# Patient Record
Sex: Female | Born: 1990 | Race: Black or African American | Hispanic: No | Marital: Married | Smoking: Never smoker
Health system: Southern US, Community
[De-identification: ages and names within clinical notes are randomized; demographics above are authoritative.]

## PROBLEM LIST (undated history)

## (undated) ENCOUNTER — Ambulatory Visit: Discharge status: Absent without leave | Payer: No Typology Code available for payment source

## (undated) ENCOUNTER — Inpatient Hospital Stay (HOSPITAL_COMMUNITY): Payer: Self-pay

## (undated) DIAGNOSIS — O0930 Supervision of pregnancy with insufficient antenatal care, unspecified trimester: Secondary | ICD-10-CM

## (undated) DIAGNOSIS — Z8249 Family history of ischemic heart disease and other diseases of the circulatory system: Secondary | ICD-10-CM

## (undated) DIAGNOSIS — Z6791 Unspecified blood type, Rh negative: Secondary | ICD-10-CM

## (undated) DIAGNOSIS — O26899 Other specified pregnancy related conditions, unspecified trimester: Secondary | ICD-10-CM

## (undated) DIAGNOSIS — R7611 Nonspecific reaction to tuberculin skin test without active tuberculosis: Secondary | ICD-10-CM

## (undated) DIAGNOSIS — N390 Urinary tract infection, site not specified: Secondary | ICD-10-CM

## (undated) DIAGNOSIS — D649 Anemia, unspecified: Secondary | ICD-10-CM

## (undated) HISTORY — PX: WISDOM TOOTH EXTRACTION: SHX21

## (undated) HISTORY — DX: Unspecified blood type, rh negative: Z67.91

## (undated) HISTORY — DX: Supervision of pregnancy with insufficient antenatal care, unspecified trimester: O09.30

## (undated) HISTORY — DX: Other specified pregnancy related conditions, unspecified trimester: O26.899

## (undated) HISTORY — DX: Nonspecific reaction to tuberculin skin test without active tuberculosis: R76.11

## (undated) HISTORY — DX: Family history of ischemic heart disease and other diseases of the circulatory system: Z82.49

---
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
## 2009-09-14 ENCOUNTER — Encounter: Admission: RE | Admit: 2009-09-14 | Discharge: 2009-09-14 | Payer: Self-pay | Admitting: Infectious Diseases

## 2010-06-03 ENCOUNTER — Emergency Department (HOSPITAL_COMMUNITY): Admission: EM | Admit: 2010-06-03 | Discharge: 2010-06-03 | Payer: Self-pay | Admitting: Emergency Medicine

## 2010-06-13 ENCOUNTER — Emergency Department (HOSPITAL_COMMUNITY): Admission: EM | Admit: 2010-06-13 | Discharge: 2010-06-13 | Payer: Self-pay | Admitting: Emergency Medicine

## 2010-10-08 LAB — RPR: RPR Ser Ql: NONREACTIVE

## 2010-10-08 LAB — DIFFERENTIAL
Basophils Absolute: 0.1 10*3/uL (ref 0.0–0.1)
Lymphocytes Relative: 30 % (ref 12–46)
Neutro Abs: 4.3 10*3/uL (ref 1.7–7.7)

## 2010-10-08 LAB — CBC
HCT: 35.4 % — ABNORMAL LOW (ref 36.0–46.0)
Platelets: 242 10*3/uL (ref 150–400)
RDW: 15.4 % (ref 11.5–15.5)
WBC: 8 10*3/uL (ref 4.0–10.5)

## 2011-04-17 ENCOUNTER — Encounter (HOSPITAL_COMMUNITY): Payer: Self-pay | Admitting: *Deleted

## 2011-04-17 ENCOUNTER — Inpatient Hospital Stay (HOSPITAL_COMMUNITY)
Admission: AD | Admit: 2011-04-17 | Discharge: 2011-04-17 | Disposition: A | Discharge status: Home / Self Care | Payer: Self-pay | Source: Ambulatory Visit | Attending: Obstetrics & Gynecology | Admitting: Obstetrics & Gynecology

## 2011-04-17 DIAGNOSIS — B3731 Acute candidiasis of vulva and vagina: Secondary | ICD-10-CM | POA: Insufficient documentation

## 2011-04-17 DIAGNOSIS — O2390 Unspecified genitourinary tract infection in pregnancy, unspecified trimester: Secondary | ICD-10-CM | POA: Insufficient documentation

## 2011-04-17 DIAGNOSIS — N390 Urinary tract infection, site not specified: Secondary | ICD-10-CM

## 2011-04-17 DIAGNOSIS — L293 Anogenital pruritus, unspecified: Secondary | ICD-10-CM | POA: Insufficient documentation

## 2011-04-17 DIAGNOSIS — B373 Candidiasis of vulva and vagina: Secondary | ICD-10-CM

## 2011-04-17 LAB — URINE MICROSCOPIC-ADD ON

## 2011-04-17 LAB — URINALYSIS, ROUTINE W REFLEX MICROSCOPIC
Nitrite: NEGATIVE
Specific Gravity, Urine: 1.025 (ref 1.005–1.030)
Urobilinogen, UA: 2 mg/dL — ABNORMAL HIGH (ref 0.0–1.0)

## 2011-04-17 LAB — WET PREP, GENITAL

## 2011-04-17 MED ORDER — FLUCONAZOLE 150 MG PO TABS
150.0000 mg | ORAL_TABLET | Freq: Once | ORAL | Status: AC
  Administered 2011-04-17: 150 mg via ORAL
  Filled 2011-04-17: qty 1

## 2011-04-17 MED ORDER — FLUCONAZOLE 150 MG PO TABS: 150.0000 mg | ORAL_TABLET | Freq: Once | ORAL | Status: AC

## 2011-04-17 MED ORDER — CEPHALEXIN 500 MG PO CAPS: 500.0000 mg | ORAL_CAPSULE | Freq: Three times a day (TID) | ORAL | Status: AC

## 2011-04-17 NOTE — Progress Notes (Signed)
Should be 13wk, doesn't feel preg anymore

## 2011-04-17 NOTE — ED Provider Notes (Signed)
History     Chief Complaint  Patient presents with  . Vaginal Discharge   The history is provided by the patient.   Pt is 14weeks 5 days pregnant and complains of vaginal itching and burning.  She denies abdominal cramping or bleeding.  She is waiting for Medicaid  Past Medical History  Diagnosis Date  . No pertinent past medical history     Past Surgical History  Procedure Date  . Wisdom tooth extraction     No family history on file.  History  Substance Use Topics  . Smoking status: Never Smoker   . Smokeless tobacco: Not on file  . Alcohol Use: No    Allergies: No Known Allergies  No prescriptions prior to admission    Review of Systems  Constitutional: Negative for fever.  Gastrointestinal: Negative for nausea, abdominal pain, diarrhea and constipation.  Neurological: Negative for headaches.   Physical Exam   Blood pressure 137/87, pulse 99, temperature 98.2 F (36.8 C), temperature source Oral, resp. rate 20, height 5\' 3"  (1.6 m), weight 155 lb 6.4 oz (70.489 kg), last menstrual period 01/04/2011.  Physical Exam  Vitals reviewed. Constitutional: She is oriented to person, place, and time. She appears well-developed.  HENT:  Head: Normocephalic.  Eyes: Pupils are equal, round, and reactive to light.  Neck: Normal range of motion.  Cardiovascular: Normal rate.   GI: Soft. There is no tenderness. There is no guarding.       FHR audible with doppler  Genitourinary:       Vaginal mucosa with thickened white discharge in vault; cervix clean NT; uterus 14-16 weeks size NT; adnexal without palpable enlargement or tenderness  Neurological: She is alert and oriented to person, place, and time.  Skin: Skin is warm and dry.    MAU Course  Procedures Pelvic exam Wet prep and GC/ Chlamydia FHT audible with doppler u/a MDM   Assessment and Plan  IUP with audible heart tones Yeast vaginitis clinically- one dose of Diflucan given in MAU Care transferred  over to [facility], NP [facility] 04/17/2011, 4:34 PM   Results for orders placed during the hospital encounter of 04/17/11 (from the past 24 hour(s))  URINALYSIS, ROUTINE W REFLEX MICROSCOPIC     Status: Abnormal   Collection Time   04/17/11  2:56 PM      Component Value Range   Color, Urine YELLOW  YELLOW    Appearance HAZY (*) CLEAR    Specific Gravity, Urine 1.025  1.005 - 1.030    pH 7.0  5.0 - 8.0    Glucose, UA NEGATIVE  NEGATIVE (mg/dL)   Hgb urine dipstick NEGATIVE  NEGATIVE    Bilirubin Urine NEGATIVE  NEGATIVE    Ketones, ur 15 (*) NEGATIVE (mg/dL)   Protein, ur NEGATIVE  NEGATIVE (mg/dL)   Urobilinogen, UA 2.0 (*) 0.0 - 1.0 (mg/dL)   Nitrite NEGATIVE  NEGATIVE    Leukocytes, UA MODERATE (*) NEGATIVE   URINE MICROSCOPIC-ADD ON     Status: Abnormal   Collection Time   04/17/11  2:56 PM      Component Value Range   Squamous Epithelial / LPF MANY (*) RARE    WBC, UA 7-10  <3 (WBC/hpf)   RBC / HPF 7-10  <3 (RBC/hpf)   Bacteria, UA MANY (*) RARE    Urine-Other MUCOUS PRESENT    WET PREP, GENITAL     Status: Abnormal   Collection Time   04/17/11  4:45 PM  Component Value Range   Yeast, Wet Prep MODERATE (*) NONE SEEN    Trich, Wet Prep NONE SEEN  NONE SEEN    Clue Cells, Wet Prep NONE SEEN  NONE SEEN    WBC, Wet Prep HPF POC MANY (*) NONE SEEN    Assessment:  UTI   Monilia vaginitis  Plan:  Keflex 500 mg. Po tid x 7 days   Diflucan 150 mg. Now and repeat in 3 days   Start Prenatal care.  [place], [place] 04/17/11 1753

## 2011-04-17 NOTE — Progress Notes (Signed)
Pt in to find out if everything is okay with the pregnancy.  Was told by the health department that she was pregnant back in August.  LMP 01/04/2011.  Denies any pain, bleeding.  Reports white, odorous, thick discharge with itching.

## 2011-04-18 LAB — GC/CHLAMYDIA PROBE AMP, GENITAL: GC Probe Amp, Genital: NEGATIVE

## 2011-06-18 ENCOUNTER — Encounter (HOSPITAL_COMMUNITY): Payer: Self-pay | Admitting: *Deleted

## 2011-06-18 ENCOUNTER — Inpatient Hospital Stay (HOSPITAL_COMMUNITY)
Admission: AD | Admit: 2011-06-18 | Discharge: 2011-06-18 | Disposition: A | Discharge status: Home / Self Care | Payer: Medicaid Other | Source: Ambulatory Visit | Attending: Obstetrics and Gynecology | Admitting: Obstetrics and Gynecology

## 2011-06-18 DIAGNOSIS — N390 Urinary tract infection, site not specified: Secondary | ICD-10-CM

## 2011-06-18 DIAGNOSIS — O99891 Other specified diseases and conditions complicating pregnancy: Secondary | ICD-10-CM | POA: Insufficient documentation

## 2011-06-18 DIAGNOSIS — N949 Unspecified condition associated with female genital organs and menstrual cycle: Secondary | ICD-10-CM | POA: Insufficient documentation

## 2011-06-18 HISTORY — DX: Urinary tract infection, site not specified: N39.0

## 2011-06-18 LAB — URINALYSIS, ROUTINE W REFLEX MICROSCOPIC
Hgb urine dipstick: NEGATIVE
Protein, ur: NEGATIVE mg/dL
Urobilinogen, UA: 0.2 mg/dL (ref 0.0–1.0)

## 2011-06-18 LAB — URINE MICROSCOPIC-ADD ON

## 2011-06-18 NOTE — Progress Notes (Signed)
Haven't been seen since last vist here.  Has first appt at [facility] 11/27 with the nurse. Feels like 'rumbling' in abd.

## 2011-06-18 NOTE — Progress Notes (Signed)
Patient states she is here to see if her UTI has resolved

## 2011-06-18 NOTE — ED Provider Notes (Signed)
Jamie Barker is a 20 y.o. female presenting at [redacted]w[redacted]d for follow-up on a UTI.  Patient was seen in the MAU previously with complaints on vaginal itching and burning and was placed on keflex 500mg  po BID x 7days.  She return today for test of cure.  Denies any vaginal leakage or bleeding.  Not feeling any contractions.  Endorses fetal movement. History OB History    Grav Para Term Preterm Abortions TAB SAB Ect Mult Living   1              Past Medical History  Diagnosis Date  . No pertinent past medical history   . UTI (urinary tract infection)    Past Surgical History  Procedure Date  . Wisdom tooth extraction    Family History: family history is not on file. Social History:  reports that she has never smoked. She does not have any smokeless tobacco history on file. She reports that she does not drink alcohol or use illicit drugs.  ROS As per HPI   Blood pressure 113/54, pulse 88, temperature 99.1 F (37.3 C), temperature source Oral, resp. rate 16, height 5\' 3"  (1.6 m), weight 73.029 kg (161 lb), last menstrual period 01/04/2011. Exam Physical Exam  Constitutional: She is oriented to person, place, and time. She appears well-developed and well-nourished.  HENT:  Head: Normocephalic.  Cardiovascular: Normal rate, regular rhythm and normal heart sounds.   Respiratory: Effort normal and breath sounds normal. She has no wheezes. She has no rales.  GI: Soft. There is no tenderness. There is no rebound and no guarding.  Neurological: She is alert and oriented to person, place, and time.  Skin: Skin is warm and dry.  Psychiatric: She has a normal mood and affect. Her behavior is normal. Judgment and thought content normal.    Prenatal labs: ABO, Rh:   Antibody:   Rubella:   RPR:    HBsAg:    HIV:    GBS:    Results for orders placed during the hospital encounter of 06/18/11 (from the past 24 hour(s))  URINALYSIS, ROUTINE W REFLEX MICROSCOPIC     Status: Abnormal   Collection Time   06/18/11  6:05 PM      Component Value Range   Color, Urine YELLOW  YELLOW    Appearance HAZY (*) CLEAR    Specific Gravity, Urine 1.020  1.005 - 1.030    pH 7.5  5.0 - 8.0    Glucose, UA 100 (*) NEGATIVE (mg/dL)   Hgb urine dipstick NEGATIVE  NEGATIVE    Bilirubin Urine NEGATIVE  NEGATIVE    Ketones, ur NEGATIVE  NEGATIVE (mg/dL)   Protein, ur NEGATIVE  NEGATIVE (mg/dL)   Urobilinogen, UA 0.2  0.0 - 1.0 (mg/dL)   Nitrite NEGATIVE  NEGATIVE    Leukocytes, UA SMALL (*) NEGATIVE   URINE MICROSCOPIC-ADD ON     Status: Abnormal   Collection Time   06/18/11  6:05 PM      Component Value Range   Squamous Epithelial / LPF MANY (*) RARE    WBC, UA 7-10  <3 (WBC/hpf)   Bacteria, UA FEW (*) RARE    Urine-Other MUCOUS PRESENT     Assessment/Plan: A: 20 y.o. presenting at [redacted]w[redacted]d for follow-up test of cure on UIT P: On urinalysis today patient's finding and not consistent with continue infection, in addition she is now asymptomatic.  Will send urine for culture and follow-up with patient on any abnormal growth.  Drucie Ip 06/18/2011,  10:07 PM

## 2011-06-18 NOTE — Progress Notes (Signed)
Denies n/v/d/c.  No bleeding, no pain today. Denies any urinary symptoms.   G1

## 2011-06-24 LAB — ABO/RH: RH Type: NEGATIVE

## 2011-06-24 LAB — SICKLE CELL SCREEN: Sickle Cell Screen: NEGATIVE

## 2011-07-23 ENCOUNTER — Inpatient Hospital Stay (HOSPITAL_COMMUNITY)
Admission: AD | Admit: 2011-07-23 | Discharge: 2011-07-24 | Disposition: A | Discharge status: Home / Self Care | Payer: Medicaid Other | Source: Ambulatory Visit | Attending: Obstetrics and Gynecology | Admitting: Obstetrics and Gynecology

## 2011-07-23 DIAGNOSIS — Z298 Encounter for other specified prophylactic measures: Secondary | ICD-10-CM | POA: Insufficient documentation

## 2011-07-23 DIAGNOSIS — Z3480 Encounter for supervision of other normal pregnancy, unspecified trimester: Secondary | ICD-10-CM | POA: Insufficient documentation

## 2011-07-23 DIAGNOSIS — Z2989 Encounter for other specified prophylactic measures: Secondary | ICD-10-CM | POA: Insufficient documentation

## 2011-07-23 MED ORDER — RHO D IMMUNE GLOBULIN 1500 UNIT/2ML IJ SOLN
300.0000 ug | Freq: Once | INTRAMUSCULAR | Status: AC
  Administered 2011-07-24: 300 ug via INTRAMUSCULAR

## 2011-07-23 NOTE — Progress Notes (Signed)
Pt presents for rhophylac injection

## 2011-07-24 LAB — RH IG WORKUP (INCLUDES ABO/RH)
Fetal Screen: NEGATIVE
Gestational Age(Wks): 28

## 2011-09-26 ENCOUNTER — Ambulatory Visit (INDEPENDENT_AMBULATORY_CARE_PROVIDER_SITE_OTHER): Payer: Medicaid Other | Admitting: Obstetrics and Gynecology

## 2011-09-26 DIAGNOSIS — B3731 Acute candidiasis of vulva and vagina: Secondary | ICD-10-CM

## 2011-09-26 DIAGNOSIS — Z3480 Encounter for supervision of other normal pregnancy, unspecified trimester: Secondary | ICD-10-CM

## 2011-09-26 DIAGNOSIS — B373 Candidiasis of vulva and vagina: Secondary | ICD-10-CM

## 2011-09-30 ENCOUNTER — Encounter: Payer: Medicaid Other | Admitting: Obstetrics and Gynecology

## 2011-10-03 ENCOUNTER — Encounter (INDEPENDENT_AMBULATORY_CARE_PROVIDER_SITE_OTHER): Payer: Medicaid Other

## 2011-10-03 ENCOUNTER — Encounter: Payer: Medicaid Other | Admitting: Obstetrics and Gynecology

## 2011-10-03 DIAGNOSIS — Z3400 Encounter for supervision of normal first pregnancy, unspecified trimester: Secondary | ICD-10-CM

## 2011-10-03 DIAGNOSIS — O26849 Uterine size-date discrepancy, unspecified trimester: Secondary | ICD-10-CM

## 2011-10-07 ENCOUNTER — Other Ambulatory Visit: Payer: Self-pay | Admitting: Obstetrics and Gynecology

## 2011-10-07 ENCOUNTER — Inpatient Hospital Stay (HOSPITAL_COMMUNITY): Payer: Medicaid Other

## 2011-10-07 ENCOUNTER — Encounter (HOSPITAL_COMMUNITY): Payer: Self-pay | Admitting: *Deleted

## 2011-10-07 ENCOUNTER — Inpatient Hospital Stay (HOSPITAL_COMMUNITY)
Admission: AD | Admit: 2011-10-07 | Discharge: 2011-10-07 | Disposition: A | Discharge status: Home / Self Care | Payer: Medicaid Other | Source: Ambulatory Visit | Attending: Obstetrics and Gynecology | Admitting: Obstetrics and Gynecology

## 2011-10-07 ENCOUNTER — Encounter (INDEPENDENT_AMBULATORY_CARE_PROVIDER_SITE_OTHER): Payer: Medicaid Other | Admitting: Registered Nurse

## 2011-10-07 DIAGNOSIS — O368190 Decreased fetal movements, unspecified trimester, not applicable or unspecified: Secondary | ICD-10-CM

## 2011-10-07 DIAGNOSIS — O368390 Maternal care for abnormalities of the fetal heart rate or rhythm, unspecified trimester, not applicable or unspecified: Secondary | ICD-10-CM | POA: Insufficient documentation

## 2011-10-07 DIAGNOSIS — Z331 Pregnant state, incidental: Secondary | ICD-10-CM

## 2011-10-07 DIAGNOSIS — Z3689 Encounter for other specified antenatal screening: Secondary | ICD-10-CM

## 2011-10-07 HISTORY — DX: Anemia, unspecified: D64.9

## 2011-10-07 NOTE — MAU Note (Signed)
History   21 yo G1P0 at 35 3/7 weeks presented from office for monitoring/BPP due to non-reactive NST in the office.  Called the office with report of decreased FM x 4 days--now reports feeling fetal movement of at least 10/day, but "just less than usual".  Denies leaking or bleeding, now noting +FM.  NST at office was non-reactive, with no availability of [place] for BPP.  Pregnancy remarkable for: Late to care at 25 weeks Rh negative Mild anemia UTI during pregnancy GBS negative    OB History    Grav Para Term Preterm Abortions TAB SAB Ect Mult Living   1 0 0 0 0 0 0 0 0 0       Past Medical History  Diagnosis Date  . UTI (urinary tract infection)   . Anemia     Past Surgical History  Procedure Date  . Wisdom tooth extraction     Family History  Problem Relation Age of Onset  . Anemia Mother     History  Substance Use Topics  . Smoking status: Never Smoker   . Smokeless tobacco: Not on file  . Alcohol Use: No    Allergies: No Known Allergies  Prescriptions prior to admission  Medication Sig Dispense Refill  . Prenatal Vit-Fe Fumarate-FA (PRENATAL MULTIVITAMIN) TABS Take 1 tablet by mouth daily.         Physical Exam   Blood pressure 126/75, pulse 91, temperature 98 F (36.7 C), temperature source Oral, resp. rate 18, last menstrual period 01/04/2011.  Chest clear Heart RRR without murmur Abd gravid, NT Pelvic--deferred Ext WNL  FHR reactive on initial tracing, no decels No UCs.  ED Course  IUP at 39 3/7 weeks Non-reactive NST at office  Consulted with Dr. Normand Sloop Will do BPP--if WNL will d/c home with [facility]. Has ROB appointment on Friday this week.  Nigel Bridgeman, CNM, [place] 09/09/11 5:35pm  Addendum: Returned from [place]:  BPP 8/8, normal fluid FHR remains reactive, occasional contractions  Reviewed FKCs, s/s labor, plan of care at term Keep scheduled apppointment at [facility] on Friday, or call prn.  Nigel Bridgeman, CNM, [place] 10/07/11 6:15pm

## 2011-10-07 NOTE — Discharge Instructions (Signed)

## 2011-10-10 ENCOUNTER — Encounter (INDEPENDENT_AMBULATORY_CARE_PROVIDER_SITE_OTHER): Payer: Medicaid Other | Admitting: Obstetrics and Gynecology

## 2011-10-10 DIAGNOSIS — Z331 Pregnant state, incidental: Secondary | ICD-10-CM

## 2011-10-15 ENCOUNTER — Other Ambulatory Visit (INDEPENDENT_AMBULATORY_CARE_PROVIDER_SITE_OTHER): Payer: Medicaid Other

## 2011-10-15 ENCOUNTER — Encounter: Payer: Medicaid Other | Admitting: Obstetrics and Gynecology

## 2011-10-15 DIAGNOSIS — O480 Post-term pregnancy: Secondary | ICD-10-CM

## 2011-10-16 ENCOUNTER — Encounter (HOSPITAL_COMMUNITY): Payer: Self-pay | Admitting: *Deleted

## 2011-10-16 ENCOUNTER — Telehealth (HOSPITAL_COMMUNITY): Payer: Self-pay | Admitting: *Deleted

## 2011-10-16 NOTE — Telephone Encounter (Signed)
Preadmission screen  

## 2011-10-18 ENCOUNTER — Encounter (HOSPITAL_COMMUNITY): Payer: Self-pay | Admitting: *Deleted

## 2011-10-18 ENCOUNTER — Encounter (HOSPITAL_COMMUNITY): Payer: Self-pay | Admitting: Anesthesiology

## 2011-10-18 ENCOUNTER — Inpatient Hospital Stay (HOSPITAL_COMMUNITY): Payer: Medicaid Other | Admitting: Anesthesiology

## 2011-10-18 ENCOUNTER — Inpatient Hospital Stay (HOSPITAL_COMMUNITY)
Admission: AD | Admit: 2011-10-18 | Discharge: 2011-10-21 | DRG: 775 | Disposition: A | Discharge status: Home / Self Care | Payer: Medicaid Other | Source: Ambulatory Visit | Attending: Obstetrics and Gynecology | Admitting: Obstetrics and Gynecology

## 2011-10-18 DIAGNOSIS — IMO0001 Reserved for inherently not codable concepts without codable children: Secondary | ICD-10-CM

## 2011-10-18 DIAGNOSIS — O480 Post-term pregnancy: Secondary | ICD-10-CM | POA: Diagnosis present

## 2011-10-18 DIAGNOSIS — D649 Anemia, unspecified: Secondary | ICD-10-CM | POA: Diagnosis not present

## 2011-10-18 DIAGNOSIS — Z331 Pregnant state, incidental: Secondary | ICD-10-CM

## 2011-10-18 DIAGNOSIS — O9903 Anemia complicating the puerperium: Secondary | ICD-10-CM | POA: Diagnosis not present

## 2011-10-18 LAB — CBC
MCH: 26.9 pg (ref 26.0–34.0)
MCHC: 31.9 g/dL (ref 30.0–36.0)
MCV: 84.2 fL (ref 78.0–100.0)
Platelets: 247 10*3/uL (ref 150–400)
RDW: 16.8 % — ABNORMAL HIGH (ref 11.5–15.5)

## 2011-10-18 LAB — RPR: RPR Ser Ql: NONREACTIVE

## 2011-10-18 MED ORDER — OXYTOCIN BOLUS FROM INFUSION
500.0000 mL | Freq: Once | INTRAVENOUS | Status: DC
  Filled 2011-10-18: qty 500

## 2011-10-18 MED ORDER — ONDANSETRON HCL 4 MG/2ML IJ SOLN: 4.0000 mg | Freq: Four times a day (QID) | INTRAMUSCULAR | Status: DC | PRN

## 2011-10-18 MED ORDER — NALBUPHINE SYRINGE 5 MG/0.5 ML: 5.0000 mg | INJECTION | INTRAMUSCULAR | Status: DC | PRN

## 2011-10-18 MED ORDER — IBUPROFEN 600 MG PO TABS
600.0000 mg | ORAL_TABLET | Freq: Four times a day (QID) | ORAL | Status: DC | PRN
  Administered 2011-10-19: 600 mg via ORAL
  Filled 2011-10-18: qty 1

## 2011-10-18 MED ORDER — LACTATED RINGERS IV SOLN
500.0000 mL | Freq: Once | INTRAVENOUS | Status: AC
  Administered 2011-10-18: 500 mL via INTRAVENOUS

## 2011-10-18 MED ORDER — PHENYLEPHRINE 40 MCG/ML (10ML) SYRINGE FOR IV PUSH (FOR BLOOD PRESSURE SUPPORT)
80.0000 ug | PREFILLED_SYRINGE | INTRAVENOUS | Status: DC | PRN
  Filled 2011-10-18: qty 5

## 2011-10-18 MED ORDER — DIPHENHYDRAMINE HCL 50 MG/ML IJ SOLN: 12.5000 mg | INTRAMUSCULAR | Status: DC | PRN

## 2011-10-18 MED ORDER — FENTANYL 2.5 MCG/ML BUPIVACAINE 1/10 % EPIDURAL INFUSION (WH - ANES)
14.0000 mL/h | INTRAMUSCULAR | Status: DC
  Administered 2011-10-18: 14 mL/h via EPIDURAL
  Administered 2011-10-18: 14.5 mL/h via EPIDURAL
  Administered 2011-10-18 – 2011-10-19 (×2): 14 mL/h via EPIDURAL
  Filled 2011-10-18 (×4): qty 60

## 2011-10-18 MED ORDER — LIDOCAINE HCL (PF) 1 % IJ SOLN
INTRAMUSCULAR | Status: DC | PRN
  Administered 2011-10-18 (×2): 5 mL

## 2011-10-18 MED ORDER — PHENYLEPHRINE 40 MCG/ML (10ML) SYRINGE FOR IV PUSH (FOR BLOOD PRESSURE SUPPORT): 80.0000 ug | PREFILLED_SYRINGE | INTRAVENOUS | Status: DC | PRN

## 2011-10-18 MED ORDER — OXYTOCIN 20 UNITS IN LACTATED RINGERS INFUSION - SIMPLE
125.0000 mL/h | Freq: Once | INTRAVENOUS | Status: AC
  Administered 2011-10-19: 125 mL/h via INTRAVENOUS

## 2011-10-18 MED ORDER — EPHEDRINE 5 MG/ML INJ
10.0000 mg | INTRAVENOUS | Status: DC | PRN
  Filled 2011-10-18: qty 4

## 2011-10-18 MED ORDER — OXYTOCIN 20 UNITS IN LACTATED RINGERS INFUSION - SIMPLE
1.0000 m[IU]/min | INTRAVENOUS | Status: DC
  Administered 2011-10-18: 1 m[IU]/min via INTRAVENOUS
  Filled 2011-10-18: qty 1000

## 2011-10-18 MED ORDER — ACETAMINOPHEN 325 MG PO TABS: 650.0000 mg | ORAL_TABLET | ORAL | Status: DC | PRN

## 2011-10-18 MED ORDER — EPHEDRINE 5 MG/ML INJ: 10.0000 mg | INTRAVENOUS | Status: DC | PRN

## 2011-10-18 MED ORDER — CITRIC ACID-SODIUM CITRATE 334-500 MG/5ML PO SOLN: 30.0000 mL | ORAL | Status: DC | PRN

## 2011-10-18 MED ORDER — TERBUTALINE SULFATE 1 MG/ML IJ SOLN: 0.2500 mg | Freq: Once | INTRAMUSCULAR | Status: AC | PRN

## 2011-10-18 MED ORDER — FLEET ENEMA 7-19 GM/118ML RE ENEM: 1.0000 | ENEMA | RECTAL | Status: DC | PRN

## 2011-10-18 MED ORDER — LACTATED RINGERS IV SOLN: 500.0000 mL | INTRAVENOUS | Status: DC | PRN

## 2011-10-18 MED ORDER — LIDOCAINE HCL (PF) 1 % IJ SOLN: 30.0000 mL | INTRAMUSCULAR | Status: DC | PRN

## 2011-10-18 MED ORDER — OXYCODONE-ACETAMINOPHEN 5-325 MG PO TABS: 1.0000 | ORAL_TABLET | ORAL | Status: DC | PRN

## 2011-10-18 MED ORDER — LACTATED RINGERS IV SOLN
INTRAVENOUS | Status: DC
  Administered 2011-10-18 – 2011-10-19 (×3): via INTRAVENOUS

## 2011-10-18 NOTE — Anesthesia Preprocedure Evaluation (Signed)

## 2011-10-18 NOTE — MAU Provider Note (Signed)
  History    CSN: [id number] Arrival date and time: 10/18/11 0932  None     Chief Complaint  Patient presents with  . Contractions   HPI Pt is a G1 P0 at 41.0wks reports to MAU with c/o uterine contractions since 0600 am today with increased frequency and intensity.  Denies ROM or bldg.  Reports nml fetal movement.  Denies any complications during this pregnancy.    OB History    Grav Para Term Preterm Abortions TAB SAB Ect Mult Living   1 0 0 0 0 0 0 0 0 0       Past Medical History  Diagnosis Date  . UTI (urinary tract infection)   . Anemia   . History of positive PPD, untreated   . Late prenatal care     Past Surgical History  Procedure Date  . Wisdom tooth extraction     Family History  Problem Relation Age of Onset  . Anemia Mother   . Hypertension Father   . Consanguinity Maternal Aunt   . Consanguinity Maternal Uncle   . Consanguinity Paternal Aunt   . Consanguinity Paternal Uncle   . Consanguinity Cousin     History  Substance Use Topics  . Smoking status: Never Smoker   . Smokeless tobacco: Not on file  . Alcohol Use: No    Allergies: No Known Allergies  Prescriptions prior to admission  Medication Sig Dispense Refill  . Prenatal Vit-Fe Fumarate-FA (PRENATAL MULTIVITAMIN) TABS Take 1 tablet by mouth daily.        Review of Systems  Constitutional: Negative.   HENT: Negative.   Eyes: Negative.   Respiratory: Negative.   Cardiovascular: Negative.   Gastrointestinal: Negative.   Genitourinary: Negative.   Musculoskeletal: Negative.   Skin: Negative.   Neurological: Negative.   Endo/Heme/Allergies: Negative.   Psychiatric/Behavioral: Negative.    Physical Exam   Blood pressure 127/78, pulse 91, temperature 98.9 F (37.2 C), temperature source Oral, resp. rate 18, height 5\' 4"  (1.626 m), weight 83.008 kg (183 lb), last menstrual period 01/04/2011.  Physical Exam  Constitutional: She is oriented to person, place, and time. She appears  well-developed and well-nourished.  HENT:  Head: Normocephalic and atraumatic.  Right Ear: External ear normal.  Left Ear: External ear normal.  Nose: Nose normal.  Eyes: Conjunctivae are normal. Pupils are equal, round, and reactive to light.  Neck: Normal range of motion. Neck supple. No thyromegaly present.  Cardiovascular: Normal rate, regular rhythm and intact distal pulses.   Respiratory: Effort normal and breath sounds normal.  GI: Soft. Bowel sounds are normal.  Genitourinary: Vagina normal and uterus normal.  Musculoskeletal: Normal range of motion. She exhibits edema.       Tr edema noted lower extrems.  Neurological: She is alert and oriented to person, place, and time. She has normal reflexes.  Skin: Skin is warm and dry.  Psychiatric: She has a normal mood and affect. Her behavior is normal.   FHR baseline 145bpm.  Moderate variability present.  Accels present.   No decels present. UCs every 2-4mins on toco and mild to moderate to palpation SVE:  Dilation 2cm/70% effacement/-3 station/posterior/soft/vertex  MAU Course  Procedures EFM  Assessment and Plan  IUP at 41.0 weeks. Uterine contractions.  Ambulate x 1 hr then reassess SVE.    Keyaan Lederman O. 10/18/2011, 10:05 AM

## 2011-10-18 NOTE — H&P (Signed)
Jamie Barker is a 21 y.o. female presenting at 41.0wks for onset of regular uterine contractions since early AM. Maternal Medical History:  Reason for admission: Reason for admission: contractions.  Contractions: Onset was 3-5 hours ago.   Frequency: regular.   Perceived severity is moderate.    Fetal activity: Perceived fetal activity is normal.   Last perceived fetal movement was within the past hour.    Prenatal complications: no prenatal complications Prenatal Complications - Diabetes: none.   Hx presents preg:  Pt entered care at 24wks.  Pt underwent US at 30wks with size c/w dates and nml 1hr GTT.  Pt txed for candidiasis at 36wks.  Repeat US performed at 38wks due to size less than dates with appropriate EFW and normal amniotic fluid.  Remainder of prenatal course uncomplicated.   OB History    Grav Para Term Preterm Abortions TAB SAB Ect Mult Living   1 0 0 0 0 0 0 0 0 0      Past Medical History  Diagnosis Date  . UTI (urinary tract infection)   . Anemia   . History of positive PPD, untreated   . Late prenatal care   . No pertinent past medical history    Past Surgical History  Procedure Date  . Wisdom tooth extraction    Family History: family history includes Anemia in her mother; Consanguinity in her cousin, maternal aunt, maternal uncle, paternal aunt, and paternal uncle; and Hypertension in her father. Social History:  reports that she has never smoked. She has never used smokeless tobacco. She reports that she does not drink alcohol or use illicit drugs.  Review of Systems  Constitutional: Negative.   HENT: Negative.   Eyes: Negative.   Respiratory: Negative.   Cardiovascular: Negative.   Gastrointestinal: Negative.   Genitourinary: Negative.   Musculoskeletal: Negative.   Skin: Negative.   Neurological: Negative.   Endo/Heme/Allergies: Negative.   Psychiatric/Behavioral: Negative.     Dilation: 4.5 Effacement (%): 90 Station: -2 Exam by:: Conni Elliot, CNM Blood pressure 108/62, pulse 84, temperature 98.7 F (37.1 C), temperature source Oral, resp. rate 18, height 5\' 4"  (1.626 m), weight 83.008 kg (183 lb), last menstrual period 01/04/2011. Maternal Exam:  Uterine Assessment: Contraction strength is moderate.  Contraction frequency is regular.   Abdomen: Patient reports no abdominal tenderness. Fundal height is 41.   Estimated fetal weight is 8#.   Fetal presentation: vertex  Introitus: Normal vulva. Normal vagina.  Ferning test: not done.  Nitrazine test: not done. Amniotic fluid character: not assessed.  Pelvis: adequate for delivery.   Cervix: Cervix evaluated by digital exam.     Fetal Exam Fetal Monitor Review: Mode: ultrasound.   Baseline rate: 125.  Variability: moderate (6-25 bpm).   Pattern: accelerations present and no decelerations.    Fetal State Assessment: Category I - tracings are normal.     Physical Exam  Constitutional: She is oriented to person, place, and time. She appears well-developed and well-nourished.  HENT:  Head: Normocephalic and atraumatic.  Right Ear: External ear normal.  Left Ear: External ear normal.  Nose: Nose normal.  Eyes: Conjunctivae are normal. Pupils are equal, round, and reactive to light.  Neck: Normal range of motion. Neck supple. No thyromegaly present.  Cardiovascular: Normal rate, regular rhythm and intact distal pulses.   Respiratory: Effort normal and breath sounds normal.  GI: Soft. Bowel sounds are normal.  Genitourinary: Vagina normal and uterus normal.  Musculoskeletal: Normal range of motion. She exhibits  no edema.  Neurological: She is alert and oriented to person, place, and time.  Skin: Skin is warm and dry.  Psychiatric: She has a normal mood and affect. Her behavior is normal.    Prenatal labs: ABO, Rh: --/--/O NEG (12/26 2225) Antibody: NEG (12/26 2225) Rubella:  Immune RPR: Nonreactive (11/27 0000)  HBsAg: Negative (11/27 0000)  HIV:  Non-reactive (11/27 0000)  GBS: Negative (02/22 0000)   Assessment/Plan IUP at 41.0wks Active labor  Admitted to [facility] per consult with Dr. Su Hilt. Pt plans epidural for pain relief during labor.   Routine admission orders.     Ben Habermann O. 10/18/2011, 1:36 PM

## 2011-10-18 NOTE — Treatment Plan (Signed)
Report to Elease Hashimoto, RN Charge  .  Patient may go to 162

## 2011-10-18 NOTE — Anesthesia Procedure Notes (Signed)
Epidural Patient location during procedure: OB Start time: 10/18/2011 1:55 PM  Staffing Anesthesiologist: Brayton Caves R Performed by: anesthesiologist   Preanesthetic Checklist Completed: patient identified, site marked, surgical consent, pre-op evaluation, timeout performed, IV checked, risks and benefits discussed and monitors and equipment checked  Epidural Patient position: sitting Prep: site prepped and draped and DuraPrep Patient monitoring: continuous pulse ox and blood pressure Approach: midline Injection technique: LOR air and LOR saline  Needle:  Needle type: Tuohy  Needle gauge: 17 G Needle length: 9 cm Needle insertion depth: 5 cm cm Catheter type: closed end flexible Catheter size: 19 Gauge Catheter at skin depth: 10 cm Test dose: negative  Assessment Events: blood not aspirated, injection not painful, no injection resistance, negative IV test and no paresthesia  Additional Notes Patient identified.  Risk benefits discussed including failed block, incomplete pain control, headache, nerve damage, paralysis, blood pressure changes, nausea, vomiting, reactions to medication both toxic or allergic, and postpartum back pain.  Patient expressed understanding and wished to proceed.  All questions were answered.  Sterile technique used throughout procedure and epidural site dressed with sterile barrier dressing. No paresthesia or other complications noted.The patient did not experience any signs of intravascular injection such as tinnitus or metallic taste in mouth nor signs of intrathecal spread such as rapid motor block. Please see nursing notes for vital signs.

## 2011-10-18 NOTE — Progress Notes (Signed)
Jamie Barker is a 21 y.o. G1P0000 at [redacted]w[redacted]d admitted for active labor  Subjective: Remains comfortable with epidural and no complaints at present.  Objective: BP 117/42  Pulse 73  Temp(Src) 98.6 F (37 C) (Oral)  Resp 18  Ht 5\' 4"  (1.626 m)  Wt 83.008 kg (183 lb)  BMI 31.41 kg/m2  SpO2 100%  LMP 01/04/2011     FHT:  FHR: 125 bpm, variability: moderate,  accelerations:  Present,  decelerations:  Absent UC:   regular, every 2-3 minutes SVE:   Dilation: 6 Effacement (%): 80 Station: -2 Exam by:: Jamarrion Budai, cnm  Labs: Lab Results  Component Value Date   WBC 12.5* 10/18/2011   HGB 11.4* 10/18/2011   HCT 35.7* 10/18/2011   MCV 84.2 10/18/2011   PLT 247 10/18/2011    Assessment / Plan: Spontaneous labor with no change x 2hrs.  Labor: Active labor with no change x 2hrs. Preeclampsia:  no signs or symptoms of toxicity Fetal Wellbeing:  Category I Pain Control:  Epidural I/D:  n/a Anticipated MOD:  NSVD  D/W pt slow cervical change and RBA pitocin augmentation and IUPC placement.  Pt agrees to proceed with the plan.  IUPC inserted without difficulty.    Will evaluate montevideo units and add pitocin to titrate to adequacy if needed.    Rayna Brenner O. 10/18/2011, 5:50 PM

## 2011-10-18 NOTE — Treatment Plan (Signed)
Call to [facility] CNM, notified of patients chief complaint, contractions pattern, gestational age, GBS status.  Will come and see patient

## 2011-10-18 NOTE — Treatment Plan (Signed)
Pt to ambulate x 1 hour

## 2011-10-18 NOTE — Progress Notes (Signed)
Jamie Barker is a 21 y.o. G1P1001 at 65w 0d  admitted for active labor  Subjective: Pt remains comfortable with epidural at present.  No complaints.    Objective: BP 119/55; P 80; R 16.  FHT:  FHR: 130 bpm, variability: moderate,  accelerations:  Present,  decelerations:  Absent UC:   regular, every 2-4 minutes.  Pitocin at 59mu/min.  MVUs 120-220 SVE:   Dilation: 8 Effacement (%):  80% Station: -2   Assessment / Plan: Augmentation of labor, progressing well  Labor: Progressing on Pitocin.   Preeclampsia:  no signs or symptoms of toxicity Fetal Wellbeing:  Category I Pain Control:  Epidural I/D:  n/a Anticipated MOD:  NSVD  Will continue to titrate pitocin to maintain adequate MVUs and continue expectant management.  Jalin Erpelding O. 11/16/2011, 6:13 PM

## 2011-10-18 NOTE — MAU Note (Signed)
Contractions started about 0800.

## 2011-10-19 ENCOUNTER — Encounter (HOSPITAL_COMMUNITY): Payer: Self-pay | Admitting: *Deleted

## 2011-10-19 MED ORDER — PRENATAL MULTIVITAMIN CH
1.0000 | ORAL_TABLET | Freq: Every day | ORAL | Status: DC
  Administered 2011-10-19 – 2011-10-21 (×4): 1 via ORAL
  Filled 2011-10-19 (×3): qty 1

## 2011-10-19 MED ORDER — SENNOSIDES-DOCUSATE SODIUM 8.6-50 MG PO TABS
2.0000 | ORAL_TABLET | Freq: Every day | ORAL | Status: DC
  Administered 2011-10-19 – 2011-10-20 (×2): 2 via ORAL

## 2011-10-19 MED ORDER — ONDANSETRON HCL 4 MG/2ML IJ SOLN: 4.0000 mg | INTRAMUSCULAR | Status: DC | PRN

## 2011-10-19 MED ORDER — OXYCODONE-ACETAMINOPHEN 5-325 MG PO TABS
1.0000 | ORAL_TABLET | ORAL | Status: DC | PRN
  Administered 2011-10-19 – 2011-10-20 (×4): 1 via ORAL
  Filled 2011-10-19 (×4): qty 1

## 2011-10-19 MED ORDER — ZOLPIDEM TARTRATE 5 MG PO TABS: 5.0000 mg | ORAL_TABLET | Freq: Every evening | ORAL | Status: DC | PRN

## 2011-10-19 MED ORDER — DIPHENHYDRAMINE HCL 25 MG PO CAPS: 25.0000 mg | ORAL_CAPSULE | Freq: Four times a day (QID) | ORAL | Status: DC | PRN

## 2011-10-19 MED ORDER — LANOLIN HYDROUS EX OINT: TOPICAL_OINTMENT | CUTANEOUS | Status: DC | PRN

## 2011-10-19 MED ORDER — IBUPROFEN 600 MG PO TABS
600.0000 mg | ORAL_TABLET | Freq: Four times a day (QID) | ORAL | Status: DC
  Administered 2011-10-19 – 2011-10-21 (×9): 600 mg via ORAL
  Filled 2011-10-19 (×9): qty 1

## 2011-10-19 MED ORDER — BENZOCAINE-MENTHOL 20-0.5 % EX AERO
1.0000 "application " | INHALATION_SPRAY | CUTANEOUS | Status: DC | PRN
  Administered 2011-10-19 – 2011-10-21 (×2): 1 via TOPICAL

## 2011-10-19 MED ORDER — DIBUCAINE 1 % RE OINT: 1.0000 "application " | TOPICAL_OINTMENT | RECTAL | Status: DC | PRN

## 2011-10-19 MED ORDER — WITCH HAZEL-GLYCERIN EX PADS: 1.0000 "application " | MEDICATED_PAD | CUTANEOUS | Status: DC | PRN

## 2011-10-19 MED ORDER — BENZOCAINE-MENTHOL 20-0.5 % EX AERO
INHALATION_SPRAY | CUTANEOUS | Status: AC
  Administered 2011-10-19: 1 via TOPICAL
  Filled 2011-10-19: qty 56

## 2011-10-19 MED ORDER — TETANUS-DIPHTH-ACELL PERTUSSIS 5-2.5-18.5 LF-MCG/0.5 IM SUSP
0.5000 mL | Freq: Once | INTRAMUSCULAR | Status: AC
  Administered 2011-10-20: 0.5 mL via INTRAMUSCULAR
  Filled 2011-10-19: qty 0.5

## 2011-10-19 MED ORDER — ONDANSETRON HCL 4 MG PO TABS: 4.0000 mg | ORAL_TABLET | ORAL | Status: DC | PRN

## 2011-10-19 MED ORDER — SIMETHICONE 80 MG PO CHEW: 80.0000 mg | CHEWABLE_TABLET | ORAL | Status: DC | PRN

## 2011-10-19 NOTE — Progress Notes (Addendum)
Jamie Barker is a 21 y.o. G1P1001 at 103w 0d admitted for active labor  Subjective: Remains comfortable with epidural and has no complaints at present.    Objective: BP 115/73  Pulse 91  Temp(Src) 98.4 F (36.9 C) (Oral)  Resp 18  Ht 5\' 4"  (1.626 m)  Wt 83.008 kg (183 lb)  BMI 31.41 kg/m2  SpO2 100%  LMP 01/04/2011  Breastfeeding? Unknown     FHT:  FHR: 125 bpm, variability: moderate,  accelerations:  Present,  decelerations:  Absent UC:   regular, every 2-3 minutes SVE:   Dilation 6 Effacement (%) 0% Station: -2 Exam by: N. Katrinka Blazing, CNM  Assessment / Plan: Active labor at 41w 0d  Labor: Active labor Preeclampsia:  no signs or symptoms of toxicity Fetal Wellbeing:  Category I Pain Control:  Epidural I/D:  n/a Anticipated MOD:  NSVD  Continue with expectant management.    Roberto Hlavaty O. 11/16/2011, 5:35 PM

## 2011-10-19 NOTE — Anesthesia Postprocedure Evaluation (Signed)
Anesthesia Post Note  Patient: Jamie Barker  Procedure(s) Performed: * No procedures listed *  Anesthesia type: Epidural  Patient location: Mother/Baby  Post pain: Pain level controlled  Post assessment: Post-op Vital signs reviewed  Last Vitals:  Filed Vitals:   10/19/11 1947  BP: 126/55  Pulse: 95  Temp: 36.7 C  Resp: 18    Post vital signs: Reviewed  Level of consciousness: awake  Complications: No apparent anesthesia complications

## 2011-10-19 NOTE — Progress Notes (Signed)
Sereen Harkins is a 21 y.o. G1P1001 at [redacted]w[redacted]d admitted for active labor  Subjective: Pt c/o some rectal pressure with UCs but no urge to push at the present time.    Objective: BP: 111/67; Pulse 88;   FHT:  FHR: 135 bpm, variability: moderate,  accelerations:  Present,  decelerations:  Present early decels present UC:   regular, every 1-4 minutes.  Pitocin at 15mu. MVUs 220. SVE:  Dilation: 10 Effacement (%): 100 Station: +2 Vertex Exam by: Elsie Ra, CNM  Assessment / Plan: IUP at 41w 1d End 1st stage of labor  Labor: Progressing normally Preeclampsia:  no signs or symptoms of toxicity Fetal Wellbeing:  Category I Pain Control:  Epidural I/D:  n/a Anticipated MOD:  NSVD  Will allow to labor down x 1hr then begin pushing unless pt has urge to push prior.    Gerrett Loman O. 11/16/2011, 6:21 PM

## 2011-10-20 ENCOUNTER — Other Ambulatory Visit: Payer: Medicaid Other

## 2011-10-20 ENCOUNTER — Encounter: Payer: Medicaid Other | Admitting: Obstetrics and Gynecology

## 2011-10-20 DIAGNOSIS — O480 Post-term pregnancy: Secondary | ICD-10-CM | POA: Diagnosis present

## 2011-10-20 LAB — CBC
MCV: 83.9 fL (ref 78.0–100.0)
Platelets: 197 10*3/uL (ref 150–400)
RBC: 2.74 MIL/uL — ABNORMAL LOW (ref 3.87–5.11)
RDW: 17.2 % — ABNORMAL HIGH (ref 11.5–15.5)
WBC: 16.5 10*3/uL — ABNORMAL HIGH (ref 4.0–10.5)

## 2011-10-20 MED ORDER — RHO D IMMUNE GLOBULIN 1500 UNIT/2ML IJ SOLN
300.0000 ug | Freq: Once | INTRAMUSCULAR | Status: AC
  Administered 2011-10-20: 300 ug via INTRAMUSCULAR
  Filled 2011-10-20: qty 2

## 2011-10-20 NOTE — Progress Notes (Signed)
Comfortable, breastfeeding well, little bleeding O VSS     Ff sm serosa flow, MLL well approximated, no edema redness, edema, or drainage, -Homan's sign bilaterally Results for orders placed during the hospital encounter of 10/18/11 (from the past 48 hour(s))  CBC     Status: Abnormal   Collection Time   10/18/11  1:05 PM      Component Value Range Comment   WBC 12.5 (*) 4.0 - 10.5 (K/uL)    RBC 4.24  3.87 - 5.11 (MIL/uL)    Hemoglobin 11.4 (*) 12.0 - 15.0 (g/dL)    HCT 16.1 (*) 09.6 - 46.0 (%)    MCV 84.2  78.0 - 100.0 (fL)    MCH 26.9  26.0 - 34.0 (pg)    MCHC 31.9  30.0 - 36.0 (g/dL)    RDW 04.5 (*) 40.9 - 15.5 (%)    Platelets 247  150 - 400 (K/uL)   RPR     Status: Normal   Collection Time   10/18/11  1:05 PM      Component Value Range Comment   RPR NON REACTIVE  NON REACTIVE    CBC     Status: Abnormal   Collection Time   10/20/11  5:15 AM      Component Value Range Comment   WBC 16.5 (*) 4.0 - 10.5 (K/uL)    RBC 2.74 (*) 3.87 - 5.11 (MIL/uL)    Hemoglobin 7.4 (*) 12.0 - 15.0 (g/dL)    HCT 81.1 (*) 91.4 - 46.0 (%)    MCV 83.9  78.0 - 100.0 (fL)    MCH 27.0  26.0 - 34.0 (pg)    MCHC 32.2  30.0 - 36.0 (g/dL)    RDW 78.2 (*) 95.6 - 15.5 (%)    Platelets 197  150 - 400 (K/uL)    A pp day 1 lactating P continue care Lavera Guise, CNM

## 2011-10-20 NOTE — Anesthesia Postprocedure Evaluation (Signed)
  Anesthesia Post-op Note  Patient: Jamie Barker  Procedure(s) Performed: * No procedures listed *  Patient Location: Mother/Baby  Anesthesia Type: Epidural  Level of Consciousness: oriented  Airway and Oxygen Therapy: Patient Spontanous Breathing  Post-op Pain: mild  Post-op Assessment: Patient's Cardiovascular Status Stable and Respiratory Function Stable  Post-op Vital Signs: stable  Complications: No apparent anesthesia complications

## 2011-10-20 NOTE — Progress Notes (Signed)
UR chart review completed.  

## 2011-10-20 NOTE — Addendum Note (Signed)
Addendum  created 10/20/11 0725 by Renford Dills, CRNA   Modules edited:Charges VN, Notes Section

## 2011-10-21 ENCOUNTER — Encounter: Payer: Medicaid Other | Admitting: Obstetrics and Gynecology

## 2011-10-21 LAB — RH IG WORKUP (INCLUDES ABO/RH)
ABO/RH(D): O NEG
Fetal Screen: NEGATIVE
Gestational Age(Wks): 38.6

## 2011-10-21 MED ORDER — BENZOCAINE-MENTHOL 20-0.5 % EX AERO
INHALATION_SPRAY | CUTANEOUS | Status: AC
  Filled 2011-10-21: qty 56

## 2011-10-21 MED ORDER — IBUPROFEN 600 MG PO TABS: 600.0000 mg | ORAL_TABLET | Freq: Four times a day (QID) | ORAL | Status: AC | PRN

## 2011-10-21 MED ORDER — FERROUS SULFATE 325 (65 FE) MG PO TABS: 325.0000 mg | ORAL_TABLET | Freq: Two times a day (BID) | ORAL | Status: AC

## 2011-10-21 NOTE — Discharge Instructions (Signed)
Vaginal Delivery Care After  Change your pad on each trip to the bathroom.   Wipe gently with toilet paper during your hospital stay. Always wipe from front to back. A spray bottle with warm tap water could also be used or a towelette if available.   Place your soiled pad and toilet paper in a bathroom wastebasket with a plastic bag liner.   During your hospital stay, save any clots. If you pass a clot while on the toilet, do not flush it. Also, if your vaginal flow seems excessive to you, notify nursing personnel.   The first time you get out of bed after delivery, wait for assistance from a nurse. Do not get up alone at any time if you feel weak or dizzy.   Bend and extend your ankles forcefully so that you feel the calves of your legs get hard. Do this 6 times every hour when you are in bed and awake.   Do not sit with one foot under you, dangle your legs over the edge of the bed, or maintain a position that hinders the circulation in your legs.   Many women experience after pains for 2 to 3 days after delivery. These after pains are mild uterine contractions. Ask the nurse for a pain medication if you need something for this. Sometimes breastfeeding stimulates after pains; if you find this to be true, ask for the medication  -  hour before the next feeding.   For you and your infant's protection, do not go beyond the door(s) of the obstetric unit. Do not carry your baby in your arms in the hallway. When taking your baby to and from your room, put your baby in the bassinet and push the bassinet.   Mothers may have their babies in their room as much as they desire.   Breastfeeding BENEFITS OF BREASTFEEDING For the baby  The first milk (colostrum) helps the baby's digestive system function better.   There are antibodies from the mother in the milk that help the baby fight off infections.   The baby has a lower incidence of asthma, allergies, and SIDS (sudden infant death syndrome).     The nutrients in breast milk are better than formulas for the baby and helps the baby's brain grow better.   Babies who breastfeed have less gas, colic, and constipation.  For the mother  Breastfeeding helps develop a very special bond between mother and baby.   It is more convenient, always available at the correct temperature and cheaper than formula feeding.   It burns calories in the mother and helps with losing weight that was gained during pregnancy.   It makes the uterus contract back down to normal size faster and slows bleeding following delivery.   Breastfeeding mothers have a lower risk of developing breast cancer.  NURSE FREQUENTLY  A healthy, full-term baby may breastfeed as often as every hour or space his or her feedings to every 3 hours.   How often to nurse will vary from baby to baby. Watch your baby for signs of hunger, not the clock.   Nurse as often as the baby requests, or when you feel the need to reduce the fullness of your breasts.   Awaken the baby if it has been 3 to 4 hours since the last feeding.   Frequent feeding will help the mother make more milk and will prevent problems like sore nipples and engorgement of the breasts.  BABY'S POSITION AT THE BREAST    Whether lying down or sitting, be sure that the baby's tummy is facing your tummy.   Support the breast with 4 fingers underneath the breast and the thumb above. Make sure your fingers are well away from the nipple and baby's mouth.   Stroke the baby's lips and cheek closest to the breast gently with your finger or nipple.   When the baby's mouth is open wide enough, place all of your nipple and as much of the dark area around the nipple as possible into your baby's mouth.   Pull the baby in close so the tip of the nose and the baby's cheeks touch the breast during the feeding.  FEEDINGS  The length of each feeding varies from baby to baby and from feeding to feeding.   The baby must suck  about 2 to 3 minutes for your milk to get to him or her. This is called a "let down." For this reason, allow the baby to feed on each breast as long as he or she wants. Your baby will end the feeding when he or she has received the right balance of nutrients.   To break the suction, put your finger into the corner of the baby's mouth and slide it between his or her gums before removing your breast from his or her mouth. This will help prevent sore nipples.  REDUCING BREAST ENGORGEMENT  In the first week after your baby is born, you may experience signs of breast engorgement. When breasts are engorged, they feel heavy, warm, full, and may be tender to the touch. You can reduce engorgement if you:   Nurse frequently, every 2 to 3 hours. Mothers who breastfeed early and often have fewer problems with engorgement.   Place light ice packs on your breasts between feedings. This reduces swelling. Wrap the ice packs in a lightweight towel to protect your skin.   Apply moist hot packs to your breast for 5 to 10 minutes before each feeding. This increases circulation and helps the milk flow.   Gently massage your breast before and during the feeding.   Make sure that the baby empties at least one breast at every feeding before switching sides.   Use a breast pump to empty the breasts if your baby is sleepy or not nursing well. You may also want to pump if you are returning to work or or you feel you are getting engorged.   Avoid bottle feeds, pacifiers or supplemental feedings of water or juice in place of breastfeeding.   Be sure the baby is latched on and positioned properly while breastfeeding.   Prevent fatigue, stress, and anemia.   Wear a supportive bra, avoiding underwire styles.   Eat a balanced diet with enough fluids.  If you follow these suggestions, your engorgement should improve in 24 to 48 hours. If you are still experiencing difficulty, call your lactation consultant or  caregiver. IS MY BABY GETTING ENOUGH MILK? Sometimes, mothers worry about whether their babies are getting enough milk. You can be assured that your baby is getting enough milk if:  The baby is actively sucking and you hear swallowing.   The baby nurses at least 8 to 12 times in a 24 hour time period. Nurse your baby until he or she unlatches or falls asleep at the first breast (at least 10 to 20 minutes), then offer the second side.   The baby is wetting 5 to 6 disposable diapers (6 to 8 cloth diapers) in a   24 hour period by 5 to 6 days of age.   The baby is having at least 2 to 3 stools every 24 hours for the first few months. Breast milk is all the food your baby needs. It is not necessary for your baby to have water or formula. In fact, to help your breasts make more milk, it is best not to give your baby supplemental feedings during the early weeks.   The stool should be soft and yellow.   The baby should gain 4 to 7 ounces per week after he is 4 days old.  TAKE CARE OF YOURSELF Take care of your breasts by:  Bathing or showering daily.   Avoiding the use of soaps on your nipples.   Start feedings on your left breast at one feeding and on your right breast at the next feeding.   You will notice an increase in your milk supply 2 to 5 days after delivery. You may feel some discomfort from engorgement, which makes your breasts very firm and often tender. Engorgement "peaks" out within 24 to 48 hours. In the meantime, apply warm moist towels to your breasts for 5 to 10 minutes before feeding. Gentle massage and expression of some milk before feeding will soften your breasts, making it easier for your baby to latch on. Wear a well fitting nursing bra and air dry your nipples for 10 to 15 minutes after each feeding.   Only use cotton bra pads.   Only use pure lanolin on your nipples after nursing. You do not need to wash it off before nursing.  Take care of yourself by:   Eating  well-balanced meals and nutritious snacks.   Drinking milk, fruit juice, and water to satisfy your thirst (about 8 glasses a day).   Getting plenty of rest.   Increasing calcium in your diet (1200 mg a day).   Avoiding foods that you notice affect the baby in a bad way.  SEEK MEDICAL CARE IF:   You have any questions or difficulty with breastfeeding.   You need help.   You have a hard, red, sore area on your breast, accompanied by a fever of 100.5 F (38.1 C) or more.   Your baby is too sleepy to eat well or is having trouble sleeping.   Your baby is wetting less than 6 diapers per day, by 5 days of age.   Your baby's skin or white part of his or her eyes is more yellow than it was in the hospital.   You feel depressed.  Document Released: 07/14/2005 Document Revised: 07/03/2011 Document Reviewed: 02/26/2009 Exit[employer] Patient Information 2012 [employer]. 

## 2011-10-21 NOTE — Discharge Summary (Signed)
Physician Discharge Summary     Obstetric Discharge Summary Reason for Admission: onset of labor Prenatal Procedures: ultrasound Intrapartum Procedures: spontaneous vaginal delivery Postpartum Procedures: Rho(D) Ig Complications-Operative and Postpartum: 2nd degree perineal laceration  Temp:  [96.8 F (36 C)-98.5 F (36.9 C)] 98.4 F (36.9 C) (03/26 0540) Pulse Rate:  [79-93] 91  (03/26 0904) Resp:  [18] 18  (03/26 0540) BP: (106-119)/(64-76) 115/73 mmHg (03/26 0904) Hemoglobin  Date Value Range Status  10/20/2011 7.4* 12.0-15.0 (g/dL) Final     REPEATED TO VERIFY     DELTA CHECK NOTED     HCT  Date Value Range Status  10/20/2011 23.0* 36.0-46.0 (%) Final   S: Doing well. Denies complaints. Breastfeeding. Lactation in with patient. Mood stable. Boding well.  O: VSS; Orthostatics-WNL  Assessment/Plan Chest: CTA Heart: RRR Fundus firm, lochia appropriate Negative Homan's-no signs of DVT Anemia-asymptomatic Offered Transfusion-Declined  Unsure re: BCM; May desire long term method, options discussed Reviewed signs and symptoms to report FESO4 325MG  1 PO BID  RTO x 5-6 weeks for [facility] exam    Hospital Course:  Hospital Course: Admitted in labor. negative GBS. Progressed to fully dilated. Delivery was performed by Gevena Barre, CNM without difficulty. Patient and baby tolerated the procedure without difficulty, with a 2nd degree perineal laceration noted. Infant to FTN. Mother and infant then had an uncomplicated postpartum course, with breastfeeding feeding going well. Mom's physical exam was WNL, and she was discharged home in stable condition. Contraception plan was unsure presently, abstinence until 5-6 week PP visit.  She received adequate benefit from po pain medications.  Discharge Diagnoses: Post-date pregnancy  Discharge Information: Date: 10/21/2011 Activity: nothing in vagina x 6 weeks Diet: routine Medications:  Medication List  As of 10/21/2011  9:13 AM   START  taking these medications         ferrous sulfate 325 (65 FE) MG tablet   Take 1 tablet (325 mg total) by mouth 2 (two) times daily.      ibuprofen 600 MG tablet   Commonly known as: ADVIL,MOTRIN   Take 1 tablet (600 mg total) by mouth every 6 (six) hours as needed for pain.         CONTINUE taking these medications         prenatal multivitamin Tabs          Where to get your medications    These are the prescriptions that you need to pick up.   You may get these medications from any pharmacy.         ferrous sulfate 325 (65 FE) MG tablet   ibuprofen 600 MG tablet           Condition: stable Instructions: refer to practice specific booklet Discharge to: home   Newborn Data: Live born  Information for the patient's newborn:  Hafford, Girl Nevae [[id number]]  female ; APGAR 9,9 ; weight 7 lb. 7.4 oz   Kizzie Fantasia CORI 10/21/2011, 9:13 AM       Signed: Kizzie Fantasia CORI 10/21/2011, 9:13 AM

## 2011-10-22 ENCOUNTER — Inpatient Hospital Stay (HOSPITAL_COMMUNITY): Admission: RE | Admit: 2011-10-22 | Payer: Medicaid Other | Source: Ambulatory Visit

## 2011-10-23 ENCOUNTER — Other Ambulatory Visit: Payer: Medicaid Other

## 2011-11-16 NOTE — Progress Notes (Signed)
Jamie Barker is a 21 y.o. G1P1001 at [redacted]w[redacted]d admitted in active labor  Subjective: Pt now comfortable with epidural. Has no complaints at the present time.  Objective: BP114/56; P 77; R 16; Temp 98.2 Resting quietly in bed.    FHT:  FHR: 125 bpm, variability: moderate,  accelerations:  Present,  decelerations:  Absent UC:   regular, every 1-7 minutes SVE:   Dilation: 6cm Effacement (%): 80% Station: -2 Clear fluid after SROM at 1320  Assessment / Plan: Spontaneous labor, progressing normally  Labor: Progressing normally Preeclampsia:  no signs or symptoms of toxicity Fetal Wellbeing:  Category I Pain Control:  Epidural I/D:  n/a Anticipated MOD:  NSVD  Continue with expectant management.  Jamie Guereca O. 11/16/2011, 5:23 PM

## 2011-11-16 NOTE — Progress Notes (Signed)
Delivery Note Pt began pushing at 3:00 AM.  Pt pushed effectively with contractions.    At 3:55 AM a viable female was delivered via Vaginal, Spontaneous Delivery (Presentation: ;  ). No nuchal cord.  No difficulty with shoulders.  APGAR: 9, 9; weight 7 lb 7.4 oz (3385 g).   Placenta status: Intact, Spontaneous.  Cord: 3 vessels with the following complications: None.  Cord pH: N/A  Anesthesia: Epidural  Episiotomy: None Lacerations: 2nd degree perineal and 2nd degree bilateral labial lacerations.   Suture Repair: 3.0 Vicryl Est. Blood Loss (mL): 350  Mom to postpartum.  Baby to nursery-stable.  Miley Blanchett O. 11/16/2011, 6:45 PM

## 2011-12-04 ENCOUNTER — Ambulatory Visit: Payer: Medicaid Other | Admitting: Obstetrics and Gynecology

## 2011-12-25 ENCOUNTER — Encounter: Payer: Self-pay | Admitting: Obstetrics and Gynecology

## 2011-12-25 ENCOUNTER — Ambulatory Visit (INDEPENDENT_AMBULATORY_CARE_PROVIDER_SITE_OTHER): Payer: Medicaid Other | Admitting: Obstetrics and Gynecology

## 2011-12-25 LAB — POCT WET PREP (WET MOUNT)
Clue Cells Wet Prep Whiff POC: NEGATIVE
PH, VAGINAL: 5.5

## 2011-12-25 MED ORDER — MEDROXYPROGESTERONE ACETATE 150 MG/ML IM SUSP: 150.0000 mg | INTRAMUSCULAR | Status: DC

## 2011-12-25 MED ORDER — METRONIDAZOLE 0.75 % VA GEL: 1.0000 | Freq: Every day | VAGINAL | Status: AC

## 2011-12-25 NOTE — Progress Notes (Signed)
Date of delivery: 10/19/2011 Female Name: Jamie Barker Vaginal delivery:yes Cesarean section:no Tubal ligation:no GDM:no Breast Feeding:yes Bottle Feeding:yes Post-Partum Blues:no Abnormal pap:no Normal GU function: yes Normal GI function:yes Returning to work:yes/ has returned back to school  EPDS: 3  *Wants to discuss birth control  (perferably Nexaplanon & Depo-Provera; brochures given)  female who presents for a postpartum visit.  ABD: soft nontender GU: vulva normal no masses seen.  Vagina normal in appearance. Small amount of granulation tissue at the introitius Cervix is parous and NT.  Uterus normal size.  No adnexal tenderness bilaterally or fullness EXT: no CCEB Depo-Provera May resume intercourse , exercise and normal activity. Use back up until Depo given RT one week BV pt given metrogel

## 2012-01-01 ENCOUNTER — Other Ambulatory Visit: Payer: Medicaid Other

## 2012-01-05 ENCOUNTER — Other Ambulatory Visit: Payer: Medicaid Other

## 2012-01-05 ENCOUNTER — Encounter: Payer: Medicaid Other | Admitting: Obstetrics and Gynecology

## 2013-07-18 ENCOUNTER — Encounter (HOSPITAL_COMMUNITY): Payer: Self-pay

## 2013-07-18 ENCOUNTER — Inpatient Hospital Stay (HOSPITAL_COMMUNITY)
Admission: AD | Admit: 2013-07-18 | Discharge: 2013-07-18 | Disposition: A | Discharge status: Home / Self Care | Payer: Medicaid Other | Source: Ambulatory Visit | Attending: Obstetrics & Gynecology | Admitting: Obstetrics & Gynecology

## 2013-07-18 DIAGNOSIS — O368190 Decreased fetal movements, unspecified trimester, not applicable or unspecified: Secondary | ICD-10-CM

## 2013-07-18 DIAGNOSIS — Z3689 Encounter for other specified antenatal screening: Secondary | ICD-10-CM

## 2013-07-18 NOTE — MAU Provider Note (Signed)
  History     CSN: [id number]  Arrival date and time: 07/18/13 7829   First Provider Initiated Contact with Patient 07/18/13 [phone]      Chief Complaint  Patient presents with  . Threatened Miscarriage   HPI  Pt is a 22 yo G2P1001 at [redacted]w[redacted]d wks IUP here with report of decreased fetal movement since yesterday.  Pt reports feeling movement 3x yesterday and none after midnight.  No report of vaginal bleeding, leaking of fluid, or contractions.  Since arrival has felt fetal movement.  Pt desires to be seen with [facility] and plans to call again tomorrow.    Past Medical History  Diagnosis Date  . UTI (urinary tract infection)   . History of positive PPD, untreated   . Late prenatal care   . Anemia   . Rh negative status during pregnancy   . FH: hypertension     Past Surgical History  Procedure Laterality Date  . Wisdom tooth extraction      Family History  Problem Relation Age of Onset  . Anemia Mother   . Hypertension Father   . Consanguinity Maternal Aunt   . Consanguinity Maternal Uncle   . Consanguinity Paternal Aunt   . Consanguinity Paternal Uncle   . Consanguinity Cousin     History  Substance Use Topics  . Smoking status: Never Smoker   . Smokeless tobacco: Never Used  . Alcohol Use: No    Allergies: No Known Allergies  Prescriptions prior to admission  Medication Sig Dispense Refill  . Prenatal Vit-Fe Fumarate-FA (PRENATAL MULTIVITAMIN) TABS Take 1 tablet by mouth daily.      Marland Kitchen terconazole (TERAZOL 3) 0.8 % vaginal cream Place 1 applicator vaginally at bedtime.      . medroxyPROGESTERone (DEPO-PROVERA) 150 MG/ML injection Inject 1 mL (150 mg total) into the muscle every 3 (three) months.  1 mL  4    Review of Systems  Constitutional:       Decreased fetal movement  All other systems reviewed and are negative.   Physical Exam   Blood pressure 122/71, pulse 85, temperature 99.2 F (37.3 C), temperature source Oral, resp. rate 18, last  menstrual period 02/09/2013, SpO2 100.00%.  Physical Exam  Constitutional: She is oriented to person, place, and time. She appears well-developed and well-nourished. No distress.  HENT:  Head: Normocephalic.  Neck: Normal range of motion. Neck supple.  Cardiovascular: Normal rate, regular rhythm and normal heart sounds.   Respiratory: Effort normal and breath sounds normal.  GI: Soft. There is no tenderness.  Genitourinary: No bleeding around the vagina.  Musculoskeletal: Normal range of motion. She exhibits no edema.  Neurological: She is alert and oriented to person, place, and time.  Skin: Skin is warm and dry.   Fetal Doppler 145 bpm  MAU Course  Procedures  No results found for this or any previous visit (from the past 24 hour(s)).  Assessment and Plan  22 yo G2P1001 at [redacted]w[redacted]d wks IUP Reassuring Fetal Well-Being  Plan: Provided reassurance Begin prenatal care Reviewed warning signs of labor.  [facility] 07/18/2013, 4:11 AM

## 2013-07-18 NOTE — MAU Note (Addendum)
Pt reports she is [redacted] weeks pregnant and had decreased fetal movement yesterday.  Today she only felt 3 movements this morning then she just felt "empty" and is worried about miscarriage.  (As I was asking her questions, she reported she is now feeling a few movements)  Pt reports she has not started her prenatal care yet.  She had a [facility] for a pregnancy test and was given medication for treatment of yeast infection.

## 2013-07-28 NOTE — L&D Delivery Note (Signed)
Delivery Note At 3:22 PM a viable and healthy female was delivered via Vaginal, Spontaneous Delivery (Presentation: ; Occiput Anterior).  APGAR: 7, 8; weight 7 lb 0.5 oz (3190 g).   Placenta status: Intact, Spontaneous.  Cord: 3 vessels with the following complications: None.  Cord pH: sent, result pending  Anesthesia: Epidural  Episiotomy: None Lacerations: Partial 3rd degree;Perineal; repaired by Dr. Despina HiddenEure Suture Repair: 3.0 Monocryl Est. Blood Loss (mL): 450  22yo G2P1001 @ G2P1001 NSVD of a viable female infant @ 15:22 over an intact perineum with a partial 3rd degree laceration. Active management of third stage of labor. Pitocin given. Placenta delivered spontaneously.  3 vessel cord, intact placenta. Repair of 3rd degree perineal laceration by Dr. Despina HiddenEure with 3-0 Moncryl. Cytotec administered PR. EBL 450ml.  Mom to postpartum.  Baby to Couplet care / Skin to Skin.  Jamie JacobsonRobyn H Barker 11/20/2013, 4:15 PM  I was present for delivery and agree with note above. Eino FarberWalidah Paul HalfN Muhammad

## 2013-08-01 ENCOUNTER — Encounter (HOSPITAL_COMMUNITY): Payer: Self-pay | Admitting: *Deleted

## 2013-08-01 ENCOUNTER — Inpatient Hospital Stay (HOSPITAL_COMMUNITY)
Admission: AD | Admit: 2013-08-01 | Discharge: 2013-08-01 | Disposition: A | Discharge status: Home / Self Care | Payer: Medicaid Other | Source: Ambulatory Visit | Attending: Obstetrics & Gynecology | Admitting: Obstetrics & Gynecology

## 2013-08-01 DIAGNOSIS — N72 Inflammatory disease of cervix uteri: Secondary | ICD-10-CM

## 2013-08-01 DIAGNOSIS — N898 Other specified noninflammatory disorders of vagina: Secondary | ICD-10-CM | POA: Insufficient documentation

## 2013-08-01 DIAGNOSIS — Z3A25 25 weeks gestation of pregnancy: Secondary | ICD-10-CM

## 2013-08-01 DIAGNOSIS — O0930 Supervision of pregnancy with insufficient antenatal care, unspecified trimester: Secondary | ICD-10-CM | POA: Insufficient documentation

## 2013-08-01 DIAGNOSIS — O99891 Other specified diseases and conditions complicating pregnancy: Secondary | ICD-10-CM | POA: Insufficient documentation

## 2013-08-01 DIAGNOSIS — O9989 Other specified diseases and conditions complicating pregnancy, childbirth and the puerperium: Principal | ICD-10-CM

## 2013-08-01 DIAGNOSIS — L293 Anogenital pruritus, unspecified: Secondary | ICD-10-CM | POA: Insufficient documentation

## 2013-08-01 LAB — WET PREP, GENITAL
CLUE CELLS WET PREP: NONE SEEN
Trich, Wet Prep: NONE SEEN
Yeast Wet Prep HPF POC: NONE SEEN

## 2013-08-01 LAB — URINALYSIS, ROUTINE W REFLEX MICROSCOPIC
Bilirubin Urine: NEGATIVE
Glucose, UA: NEGATIVE mg/dL
HGB URINE DIPSTICK: NEGATIVE
KETONES UR: 15 mg/dL — AB
Nitrite: NEGATIVE
PROTEIN: NEGATIVE mg/dL
Specific Gravity, Urine: 1.025 (ref 1.005–1.030)
Urobilinogen, UA: 1 mg/dL (ref 0.0–1.0)
pH: 6 (ref 5.0–8.0)

## 2013-08-01 LAB — URINE MICROSCOPIC-ADD ON

## 2013-08-01 LAB — OB RESULTS CONSOLE GC/CHLAMYDIA
CHLAMYDIA, DNA PROBE: NEGATIVE
GC PROBE AMP, GENITAL: NEGATIVE

## 2013-08-01 MED ORDER — AZITHROMYCIN 1 G PO PACK: 1.0000 g | PACK | Freq: Once | ORAL | Status: DC

## 2013-08-01 NOTE — Discharge Instructions (Signed)
Cervicitis °Cervicitis is a soreness and swelling (inflammation) of the cervix. Your cervix is located at the bottom of your uterus. It opens up to the vagina. °CAUSES  °· Sexually transmitted infections (STIs).   °· Allergic reaction.   °· Medicines or birth control devices that are put in the vagina.   °· Injury to the cervix.   °· Bacterial infections.   °RISK FACTORS °You are at greater risk if you: °· Have unprotected sexual intercourse. °· Have sexual intercourse with many partners. °· Began sexual intercourse at an early age. °· Have a history of STIs. °SYMPTOMS  °There may be no symptoms. If symptoms occur, they may include:  °· Grey, white, yellow, or bad-smelling vaginal discharge.   °· Pain or itching of the area outside the vagina.   °· Painful sexual intercourse.   °· Lower abdominal or lower back pain, especially during intercourse.   °· Frequent urination.   °· Abnormal vaginal bleeding between periods, after sexual intercourse, or after menopause.   °· Pressure or a heavy feeling in the pelvis.   °DIAGNOSIS  °Diagnosis is made after a pelvic exam. Other tests may include:  °· Examination of any discharge under a microscope (wet prep).   °· A Pap test.   °TREATMENT  °Treatment will depend on the cause of cervicitis. If it is caused by an STI, both you and your partner will need to be treated. Antibiotic medicines will be given.  °HOME CARE INSTRUCTIONS  °· Do not have sexual intercourse until your health care provider says it is okay.   °· Do not have sexual intercourse until your partner has been treated, if your cervicitis is caused by an STI.   °· Take your antibiotics as directed. Finish them even if you start to feel better.   °SEEK MEDICAL CARE IF: °· Your symptoms come back.   °· You have a fever.   °MAKE SURE YOU:  °· Understand these instructions. °· Will watch your condition. °· Will get help right away if you are not doing well or get worse. °Document Released: 07/14/2005 Document Revised:  03/16/2013 Document Reviewed: 01/05/2013 °[employer]® Patient Information ©2014 [employer]. ° °

## 2013-08-01 NOTE — MAU Note (Signed)
Patient states she has had a vaginal itching that started 2 days ago, yesterday started having a foul smelling green vaginal discharge. Denies pain. Reports having felt fetal movement. Patient has not had prenatal care but [facility]

## 2013-08-02 LAB — GC/CHLAMYDIA PROBE AMP
CT PROBE, AMP APTIMA: NEGATIVE
GC Probe RNA: NEGATIVE

## 2013-08-03 ENCOUNTER — Encounter: Payer: Self-pay | Admitting: Obstetrics and Gynecology

## 2013-08-08 ENCOUNTER — Other Ambulatory Visit (HOSPITAL_COMMUNITY): Payer: Self-pay | Admitting: Nurse Practitioner

## 2013-08-08 DIAGNOSIS — Z3689 Encounter for other specified antenatal screening: Secondary | ICD-10-CM

## 2013-08-08 LAB — OB RESULTS CONSOLE HIV ANTIBODY (ROUTINE TESTING): HIV: NONREACTIVE

## 2013-08-08 LAB — OB RESULTS CONSOLE RPR: RPR: NONREACTIVE

## 2013-08-18 ENCOUNTER — Ambulatory Visit (HOSPITAL_COMMUNITY)
Admission: RE | Admit: 2013-08-18 | Discharge: 2013-08-18 | Disposition: A | Discharge status: Home / Self Care | Payer: Medicaid Other | Source: Ambulatory Visit | Attending: Nurse Practitioner | Admitting: Nurse Practitioner

## 2013-08-18 DIAGNOSIS — O0930 Supervision of pregnancy with insufficient antenatal care, unspecified trimester: Secondary | ICD-10-CM | POA: Insufficient documentation

## 2013-08-18 DIAGNOSIS — Z3689 Encounter for other specified antenatal screening: Secondary | ICD-10-CM | POA: Insufficient documentation

## 2013-08-22 ENCOUNTER — Other Ambulatory Visit (HOSPITAL_COMMUNITY): Payer: Self-pay | Admitting: Nurse Practitioner

## 2013-08-22 DIAGNOSIS — Z0489 Encounter for examination and observation for other specified reasons: Secondary | ICD-10-CM

## 2013-08-22 DIAGNOSIS — IMO0002 Reserved for concepts with insufficient information to code with codable children: Secondary | ICD-10-CM

## 2013-09-15 ENCOUNTER — Ambulatory Visit (HOSPITAL_COMMUNITY): Payer: Medicaid Other | Attending: Nurse Practitioner

## 2013-09-21 ENCOUNTER — Ambulatory Visit (HOSPITAL_COMMUNITY)
Admission: RE | Admit: 2013-09-21 | Discharge: 2013-09-21 | Disposition: A | Discharge status: Home / Self Care | Payer: Medicaid Other | Source: Ambulatory Visit | Attending: Nurse Practitioner | Admitting: Nurse Practitioner

## 2013-09-21 DIAGNOSIS — O0930 Supervision of pregnancy with insufficient antenatal care, unspecified trimester: Secondary | ICD-10-CM | POA: Insufficient documentation

## 2013-09-21 DIAGNOSIS — Z0489 Encounter for examination and observation for other specified reasons: Secondary | ICD-10-CM

## 2013-09-21 DIAGNOSIS — IMO0002 Reserved for concepts with insufficient information to code with codable children: Secondary | ICD-10-CM

## 2013-09-21 DIAGNOSIS — Z3689 Encounter for other specified antenatal screening: Secondary | ICD-10-CM | POA: Insufficient documentation

## 2013-09-29 LAB — OB RESULTS CONSOLE GBS: GBS: NEGATIVE

## 2013-11-17 ENCOUNTER — Other Ambulatory Visit (HOSPITAL_COMMUNITY): Payer: Self-pay | Admitting: Family

## 2013-11-17 DIAGNOSIS — O480 Post-term pregnancy: Secondary | ICD-10-CM

## 2013-11-20 ENCOUNTER — Encounter (HOSPITAL_COMMUNITY): Payer: Self-pay

## 2013-11-20 ENCOUNTER — Inpatient Hospital Stay (HOSPITAL_COMMUNITY): Payer: Medicaid Other | Admitting: Anesthesiology

## 2013-11-20 ENCOUNTER — Encounter (HOSPITAL_COMMUNITY): Payer: Medicaid Other | Admitting: Anesthesiology

## 2013-11-20 ENCOUNTER — Inpatient Hospital Stay (HOSPITAL_COMMUNITY)
Admission: AD | Admit: 2013-11-20 | Discharge: 2013-11-22 | DRG: 775 | Disposition: A | Discharge status: Home / Self Care | Payer: Medicaid Other | Source: Ambulatory Visit | Attending: Obstetrics & Gynecology | Admitting: Obstetrics & Gynecology

## 2013-11-20 DIAGNOSIS — Z8249 Family history of ischemic heart disease and other diseases of the circulatory system: Secondary | ICD-10-CM

## 2013-11-20 DIAGNOSIS — IMO0001 Reserved for inherently not codable concepts without codable children: Secondary | ICD-10-CM

## 2013-11-20 DIAGNOSIS — O9902 Anemia complicating childbirth: Secondary | ICD-10-CM | POA: Diagnosis present

## 2013-11-20 DIAGNOSIS — D649 Anemia, unspecified: Secondary | ICD-10-CM

## 2013-11-20 DIAGNOSIS — Z843 Family history of consanguinity: Secondary | ICD-10-CM

## 2013-11-20 DIAGNOSIS — O360990 Maternal care for other rhesus isoimmunization, unspecified trimester, not applicable or unspecified: Secondary | ICD-10-CM | POA: Diagnosis present

## 2013-11-20 LAB — CBC
HCT: 33.6 % — ABNORMAL LOW (ref 36.0–46.0)
Hemoglobin: 11.1 g/dL — ABNORMAL LOW (ref 12.0–15.0)
MCH: 27.5 pg (ref 26.0–34.0)
MCHC: 33 g/dL (ref 30.0–36.0)
MCV: 83.2 fL (ref 78.0–100.0)
Platelets: 221 10*3/uL (ref 150–400)
RBC: 4.04 MIL/uL (ref 3.87–5.11)
RDW: 16.3 % — AB (ref 11.5–15.5)
WBC: 11.4 10*3/uL — AB (ref 4.0–10.5)

## 2013-11-20 LAB — RPR

## 2013-11-20 MED ORDER — IBUPROFEN 600 MG PO TABS
600.0000 mg | ORAL_TABLET | Freq: Four times a day (QID) | ORAL | Status: DC
  Administered 2013-11-20 – 2013-11-22 (×7): 600 mg via ORAL
  Filled 2013-11-20 (×7): qty 1

## 2013-11-20 MED ORDER — FENTANYL 2.5 MCG/ML BUPIVACAINE 1/10 % EPIDURAL INFUSION (WH - ANES): 14.0000 mL/h | INTRAMUSCULAR | Status: DC | PRN

## 2013-11-20 MED ORDER — MISOPROSTOL 200 MCG PO TABS
ORAL_TABLET | ORAL | Status: AC
  Filled 2013-11-20: qty 4

## 2013-11-20 MED ORDER — OXYTOCIN BOLUS FROM INFUSION
500.0000 mL | INTRAVENOUS | Status: DC
  Administered 2013-11-20: 500 mL via INTRAVENOUS

## 2013-11-20 MED ORDER — DIPHENHYDRAMINE HCL 50 MG/ML IJ SOLN: 12.5000 mg | INTRAMUSCULAR | Status: DC | PRN

## 2013-11-20 MED ORDER — SENNOSIDES-DOCUSATE SODIUM 8.6-50 MG PO TABS
2.0000 | ORAL_TABLET | ORAL | Status: DC
  Administered 2013-11-20 – 2013-11-22 (×2): 2 via ORAL
  Filled 2013-11-20 (×2): qty 2

## 2013-11-20 MED ORDER — LACTATED RINGERS IV SOLN: 500.0000 mL | Freq: Once | INTRAVENOUS | Status: DC

## 2013-11-20 MED ORDER — FENTANYL 2.5 MCG/ML BUPIVACAINE 1/10 % EPIDURAL INFUSION (WH - ANES)
INTRAMUSCULAR | Status: DC
Start: 2013-11-20 — End: 2013-11-20
  Filled 2013-11-20: qty 125

## 2013-11-20 MED ORDER — ZOLPIDEM TARTRATE 5 MG PO TABS: 5.0000 mg | ORAL_TABLET | Freq: Every evening | ORAL | Status: DC | PRN

## 2013-11-20 MED ORDER — FLEET ENEMA 7-19 GM/118ML RE ENEM: 1.0000 | ENEMA | RECTAL | Status: DC | PRN

## 2013-11-20 MED ORDER — EPHEDRINE 5 MG/ML INJ
10.0000 mg | INTRAVENOUS | Status: DC | PRN
  Filled 2013-11-20: qty 2

## 2013-11-20 MED ORDER — ONDANSETRON HCL 4 MG/2ML IJ SOLN: 4.0000 mg | Freq: Four times a day (QID) | INTRAMUSCULAR | Status: DC | PRN

## 2013-11-20 MED ORDER — WITCH HAZEL-GLYCERIN EX PADS: 1.0000 "application " | MEDICATED_PAD | CUTANEOUS | Status: DC | PRN

## 2013-11-20 MED ORDER — LANOLIN HYDROUS EX OINT: TOPICAL_OINTMENT | CUTANEOUS | Status: DC | PRN

## 2013-11-20 MED ORDER — LIDOCAINE HCL (PF) 1 % IJ SOLN
30.0000 mL | INTRAMUSCULAR | Status: DC | PRN
  Filled 2013-11-20: qty 30

## 2013-11-20 MED ORDER — BENZOCAINE-MENTHOL 20-0.5 % EX AERO
1.0000 "application " | INHALATION_SPRAY | CUTANEOUS | Status: DC | PRN
  Administered 2013-11-21: 1 via TOPICAL
  Filled 2013-11-20 (×2): qty 56

## 2013-11-20 MED ORDER — OXYCODONE-ACETAMINOPHEN 5-325 MG PO TABS: 1.0000 | ORAL_TABLET | ORAL | Status: DC | PRN

## 2013-11-20 MED ORDER — ACETAMINOPHEN 325 MG PO TABS: 650.0000 mg | ORAL_TABLET | ORAL | Status: DC | PRN

## 2013-11-20 MED ORDER — PRENATAL MULTIVITAMIN CH
1.0000 | ORAL_TABLET | Freq: Every day | ORAL | Status: DC
  Administered 2013-11-21 – 2013-11-22 (×2): 1 via ORAL
  Filled 2013-11-20 (×2): qty 1

## 2013-11-20 MED ORDER — EPHEDRINE 5 MG/ML INJ
10.0000 mg | INTRAVENOUS | Status: DC | PRN
Start: 2013-11-20 — End: 2013-11-20
  Filled 2013-11-20: qty 2

## 2013-11-20 MED ORDER — ONDANSETRON HCL 4 MG/2ML IJ SOLN: 4.0000 mg | INTRAMUSCULAR | Status: DC | PRN

## 2013-11-20 MED ORDER — CITRIC ACID-SODIUM CITRATE 334-500 MG/5ML PO SOLN: 30.0000 mL | ORAL | Status: DC | PRN

## 2013-11-20 MED ORDER — LIDOCAINE HCL (PF) 1 % IJ SOLN
INTRAMUSCULAR | Status: DC | PRN
  Administered 2013-11-20 (×2): 4 mL

## 2013-11-20 MED ORDER — IBUPROFEN 600 MG PO TABS
600.0000 mg | ORAL_TABLET | Freq: Four times a day (QID) | ORAL | Status: DC | PRN
  Administered 2013-11-20: 600 mg via ORAL
  Filled 2013-11-20: qty 1

## 2013-11-20 MED ORDER — PHENYLEPHRINE 40 MCG/ML (10ML) SYRINGE FOR IV PUSH (FOR BLOOD PRESSURE SUPPORT)
PREFILLED_SYRINGE | INTRAVENOUS | Status: AC
  Filled 2013-11-20: qty 10

## 2013-11-20 MED ORDER — ONDANSETRON HCL 4 MG PO TABS: 4.0000 mg | ORAL_TABLET | ORAL | Status: DC | PRN

## 2013-11-20 MED ORDER — EPHEDRINE 5 MG/ML INJ
INTRAVENOUS | Status: AC
  Filled 2013-11-20: qty 4

## 2013-11-20 MED ORDER — DIPHENHYDRAMINE HCL 25 MG PO CAPS: 25.0000 mg | ORAL_CAPSULE | Freq: Four times a day (QID) | ORAL | Status: DC | PRN

## 2013-11-20 MED ORDER — OXYCODONE-ACETAMINOPHEN 5-325 MG PO TABS
1.0000 | ORAL_TABLET | ORAL | Status: DC | PRN
  Administered 2013-11-20 – 2013-11-21 (×2): 1 via ORAL
  Filled 2013-11-20 (×2): qty 1

## 2013-11-20 MED ORDER — PHENYLEPHRINE 40 MCG/ML (10ML) SYRINGE FOR IV PUSH (FOR BLOOD PRESSURE SUPPORT)
80.0000 ug | PREFILLED_SYRINGE | INTRAVENOUS | Status: DC | PRN
  Filled 2013-11-20: qty 2

## 2013-11-20 MED ORDER — LACTATED RINGERS IV SOLN: 500.0000 mL | INTRAVENOUS | Status: DC | PRN

## 2013-11-20 MED ORDER — LACTATED RINGERS IV SOLN: INTRAVENOUS | Status: DC

## 2013-11-20 MED ORDER — SIMETHICONE 80 MG PO CHEW
80.0000 mg | CHEWABLE_TABLET | ORAL | Status: DC | PRN
  Administered 2013-11-20: 80 mg via ORAL
  Filled 2013-11-20: qty 1

## 2013-11-20 MED ORDER — FENTANYL 2.5 MCG/ML BUPIVACAINE 1/10 % EPIDURAL INFUSION (WH - ANES)
INTRAMUSCULAR | Status: DC | PRN
Start: 2013-11-20 — End: 2013-11-23
  Administered 2013-11-20: 14 mL/h via EPIDURAL

## 2013-11-20 MED ORDER — DIBUCAINE 1 % RE OINT
1.0000 | TOPICAL_OINTMENT | RECTAL | Status: DC | PRN
Start: 2013-11-20 — End: 2013-11-22

## 2013-11-20 MED ORDER — MISOPROSTOL 200 MCG PO TABS
800.0000 ug | ORAL_TABLET | Freq: Once | ORAL | Status: AC
  Administered 2013-11-20: 800 ug via RECTAL

## 2013-11-20 MED ORDER — OXYTOCIN 40 UNITS IN LACTATED RINGERS INFUSION - SIMPLE MED
62.5000 mL/h | INTRAVENOUS | Status: DC
  Filled 2013-11-20: qty 1000

## 2013-11-20 MED ORDER — TETANUS-DIPHTH-ACELL PERTUSSIS 5-2.5-18.5 LF-MCG/0.5 IM SUSP
0.5000 mL | Freq: Once | INTRAMUSCULAR | Status: AC
  Administered 2013-11-21: 0.5 mL via INTRAMUSCULAR
  Filled 2013-11-20: qty 0.5

## 2013-11-20 NOTE — Anesthesia Procedure Notes (Signed)
Epidural Patient location during procedure: OB Start time: 11/20/2013 1:36 PM End time: 11/20/2013 1:46 PM  Staffing Anesthesiologist: Lewie LoronGERMEROTH, Bulmaro Feagans R Performed by: anesthesiologist   Preanesthetic Checklist Completed: patient identified, pre-op evaluation, timeout performed, IV checked, risks and benefits discussed and monitors and equipment checked  Epidural Patient position: sitting Prep: site prepped and draped and DuraPrep Patient monitoring: heart rate Approach: midline Location: L3-L4 Injection technique: LOR air and LOR saline  Needle:  Needle type: Tuohy  Needle gauge: 17 G Needle length: 9 cm Needle insertion depth: 7 cm Catheter type: closed end flexible Catheter size: 19 Gauge Catheter at skin depth: 13 cm Test dose: negative  Assessment Sensory level: T8 Events: blood not aspirated, injection not painful, no injection resistance, negative IV test and no paresthesia  Additional Notes Reason for block:procedure for pain

## 2013-11-20 NOTE — MAU Note (Signed)
Report called to birthing suites charge nurse. Will go to room [id number]

## 2013-11-20 NOTE — Anesthesia Preprocedure Evaluation (Signed)

## 2013-11-20 NOTE — MAU Note (Signed)
Pt presents with complaints of contractions that started this morning at 8am. Pt denies any vaginal bleeding or LOF

## 2013-11-20 NOTE — H&P (Signed)
Jamie Barker is a 23 y.o. female G2P1001 late to prenatal care with IUP at 3666w4d presenting for contractions. Pt states she has been having regular contractions, unsure of frequency them. She denies vaginal bleeding or LOF. Positive fetal movement. Membranes are intact.  [facility]  Prenatal History/Complications:  Past Medical History: Past Medical History  Diagnosis Date  . UTI (urinary tract infection)   . History of positive PPD, untreated   . Late prenatal care   . Anemia   . Rh negative status during pregnancy   . FH: hypertension     Past Surgical History: Past Surgical History  Procedure Laterality Date  . Wisdom tooth extraction      Obstetrical History: OB History   Grav Para Term Preterm Abortions TAB SAB Ect Mult Living   2 1 1  0 0 0 0 0 0 1    G1 NSVD @ 41wks, no complications during pregnancy or delivery  Gynecological History: noncontributory  Social History: History   Social History  . Marital Status: Married    Spouse Name: N/A    Number of Children: N/A  . Years of Education: N/A   Social History Main Topics  . Smoking status: Never Smoker   . Smokeless tobacco: Never Used  . Alcohol Use: No  . Drug Use: No  . Sexual Activity: Not Currently    Birth Control/ Protection: None     Comment: Post-partum   Other Topics Concern  . None   Social History Narrative  . None    Family History: Family History  Problem Relation Age of Onset  . Anemia Mother   . Hypertension Father   . Consanguinity Maternal Aunt   . Consanguinity Maternal Uncle   . Consanguinity Paternal Aunt   . Consanguinity Paternal Uncle   . Consanguinity Cousin     Allergies: No Known Allergies  Prescriptions prior to admission  Medication Sig Dispense Refill  . azithromycin (ZITHROMAX) 1 G powder Take 1 packet (1 g total) by mouth once.  1 each  0  . Prenatal Vit-Fe Fumarate-FA (PRENATAL MULTIVITAMIN) TABS Take 1 tablet  by mouth daily.         Review of Systems   Constitutional: Denies fever or chills HEET: No headache or visual changes CV: no chest pain Lungs: no dyspnea Abdomen: contractions as above Ext: no LE edema or pain  Blood pressure 133/72, pulse 89, resp. rate 18, height 5\' 3"  (1.6 m), weight 78.926 kg (174 lb), last menstrual period 02/09/2013. General appearance: alert and mild distress Lungs: clear to auscultation bilaterally Heart: regular rate and rhythm Abdomen: soft, non-tender; bowel sounds normal, gravid, cephalic Pelvic: SVE not performed by me, just checked on admission by nursing see below Extremities: Homans sign is negative, no sign of DVT Presentation: cephalic Fetal monitoring baseline 130s, + Accels, Minimal variability, 1 early decel, Catregory II tracing Uterine activity Ctx every 4-336min Dilation: 4.5 Effacement (%): 80 Exam by:: Ginger Morris RN   Prenatal labs: ABO, Rh:  O-, Rhogam given at 33weeks Antibody:  neg Rubella:  Immune  RPR:  Neg  HBsAg:   Neg HIV:   Non reactive GBS:   negative 1 hr Glucola neg Genetic screening  Cystic Fibrosis negative, otherwise not done 2/2 late to prenatal care Anatomy US 09/21/13 wnl   Prenatal Transfer Tool  Maternal Diabetes: No Genetic Screening: Cystic Fibrosis negative, otherwise not done 2/2 late to prenatal care Maternal Ultrasounds/Referrals: Normal Fetal Ultrasounds  or other Referrals:  None Maternal Substance Abuse:  No Significant Maternal Medications:  None Significant Maternal Lab Results: GBS neg, Rh Neg     Assessment: Jamie Barker is a 23 y.o. G2P1001 at 2762w4d by LMP here for contractions #Labor:Admitted in active labor, 4-5cm. No need for augmentation at this time.  #Pain: Desires Epidural #FWB: Category II tracing #ID:  GBS neg # Rh: O negative, antibody negative, Rhogam given at 32 wks #MOF: Breastfeeding #MOC:Nexplanon #Circ:  NA  Robyn H Restrepo 11/20/2013, 1:13 PM   I  examined pt and agree with documentation above and resident plan of care. Eino FarberWalidah Paul HalfN Muhammad

## 2013-11-21 ENCOUNTER — Ambulatory Visit (HOSPITAL_COMMUNITY): Admission: RE | Admit: 2013-11-21 | Payer: Medicaid Other | Source: Ambulatory Visit

## 2013-11-21 DIAGNOSIS — O9902 Anemia complicating childbirth: Secondary | ICD-10-CM

## 2013-11-21 DIAGNOSIS — O360990 Maternal care for other rhesus isoimmunization, unspecified trimester, not applicable or unspecified: Secondary | ICD-10-CM

## 2013-11-21 DIAGNOSIS — D649 Anemia, unspecified: Secondary | ICD-10-CM

## 2013-11-21 LAB — CBC
HEMATOCRIT: 25.9 % — AB (ref 36.0–46.0)
Hemoglobin: 8.5 g/dL — ABNORMAL LOW (ref 12.0–15.0)
MCH: 27.3 pg (ref 26.0–34.0)
MCHC: 32.8 g/dL (ref 30.0–36.0)
MCV: 83.3 fL (ref 78.0–100.0)
Platelets: 214 10*3/uL (ref 150–400)
RBC: 3.11 MIL/uL — AB (ref 3.87–5.11)
RDW: 16.6 % — ABNORMAL HIGH (ref 11.5–15.5)
WBC: 16 10*3/uL — AB (ref 4.0–10.5)

## 2013-11-21 MED ORDER — RHO D IMMUNE GLOBULIN 1500 UNIT/2ML IJ SOLN
300.0000 ug | Freq: Once | INTRAMUSCULAR | Status: AC
  Administered 2013-11-21: 300 ug via INTRAMUSCULAR
  Filled 2013-11-21: qty 2

## 2013-11-21 NOTE — Progress Notes (Signed)
Post Partum Day 1 Subjective: up ad lib and tolerating PO. Bottlefeeding.  Plans to begin breastfeeding today.   Desires nexplanon for family planning. Denies dizziness, chest pain, or shortness of breath.      Objective: Blood pressure 94/60, pulse 84, temperature 98 F (36.7 C), temperature source Oral, resp. rate 20, height 5\' 4"  (1.626 m), weight 78.926 kg (174 lb), last menstrual period 02/09/2013, SpO2 97.00%, unknown if currently breastfeeding.  Physical Exam:  Filed Vitals:   11/21/13 0513  BP: 94/60  Pulse: 84  Temp: 98 F (36.7 C)  Resp: 20   General: alert, cooperative and appears stated age Lochia: appropriate Uterine Fundus: firm Incision: n/a DVT Evaluation: No evidence of DVT seen on physical exam. Negative Homan's sign.   Recent Labs  11/20/13 1305 11/21/13 0555  HGB 11.1* 8.5*  HCT 33.6* 25.9*    Assessment/Plan: Anemia-asymptomatic Postpartum Day#1  Plan for discharge tomorrow and Breastfeeding   LOS: 1 day   Walidah N Muhammad 11/21/2013, 7:53 AM

## 2013-11-21 NOTE — Progress Notes (Signed)
Ur chart review completed.  

## 2013-11-21 NOTE — Anesthesia Postprocedure Evaluation (Signed)
  Anesthesia Post-op Note  Patient: Jamie Barker  Procedure(s) Performed: * No procedures listed *  Patient Location: PACU and Mother/Baby  Anesthesia Type:Epidural  Level of Consciousness: awake, alert  and oriented  Airway and Oxygen Therapy: Patient Spontanous Breathing  Post-op Pain: mild  Post-op Assessment: Post-op Vital signs reviewed  Post-op Vital Signs: Reviewed and stable  Last Vitals:  Filed Vitals:   11/21/13 0513  BP: 94/60  Pulse: 84  Temp: 36.7 C  Resp: 20    Complications: No apparent anesthesia complications

## 2013-11-22 LAB — RH IG WORKUP (INCLUDES ABO/RH)
ABO/RH(D): O NEG
ANTIBODY SCREEN: NEGATIVE
Fetal Screen: NEGATIVE
Gestational Age(Wks): 40.4
UNIT DIVISION: 0

## 2013-11-22 MED ORDER — IBUPROFEN 800 MG PO TABS: 800.0000 mg | ORAL_TABLET | Freq: Three times a day (TID) | ORAL | Status: DC | PRN

## 2013-11-22 NOTE — Discharge Instructions (Signed)

## 2013-11-22 NOTE — Discharge Summary (Signed)
Obstetric Discharge Summary Reason for Admission: onset of labor Prenatal Procedures: ultrasound Intrapartum Procedures: spontaneous vaginal delivery Postpartum Procedures: none Complications-Operative and Postpartum: none Hemoglobin  Date Value Ref Range Status  11/21/2013 8.5* 12.0 - 15.0 g/dL Final     DELTA CHECK NOTED     REPEATED TO VERIFY     HCT  Date Value Ref Range Status  11/21/2013 25.9* 36.0 - 46.0 % Final    Physical Exam:  General: alert, cooperative, appears stated age and no distress Lochia: appropriate Uterine Fundus: firm Incision: NA DVT Evaluation: No evidence of DVT seen on physical exam.  Discharge Diagnoses: Term Pregnancy-delivered  Discharge Information: Date: 11/22/2013 Activity: unrestricted Diet: routine Medications: PNV and Ibuprofen Condition: stable Instructions: refer to practice specific booklet Discharge to: home Follow-up Information   Follow up with [facility] In 6 weeks.   Contact information:   [address] [place] [address] [phone]      Newborn Data: Live born female  Birth Weight: 7 lb 0.5 oz (3190 g) APGAR: 7, 8  Home with mother.  Tawnya CrookHeather Donovan Hogan 11/22/2013, 7:35 AM

## 2013-11-26 ENCOUNTER — Inpatient Hospital Stay (HOSPITAL_COMMUNITY): Admission: RE | Admit: 2013-11-26 | Payer: Medicaid Other | Source: Ambulatory Visit

## 2014-05-29 ENCOUNTER — Encounter (HOSPITAL_COMMUNITY): Payer: Self-pay

## 2015-09-07 IMAGING — US US OB FOLLOW-UP
1 series · 12 of 28 positions shown · non-contrast
Comparison: none

[Series 1: us ob follow up · 12 of 51 slices shown]
[im 2/51]
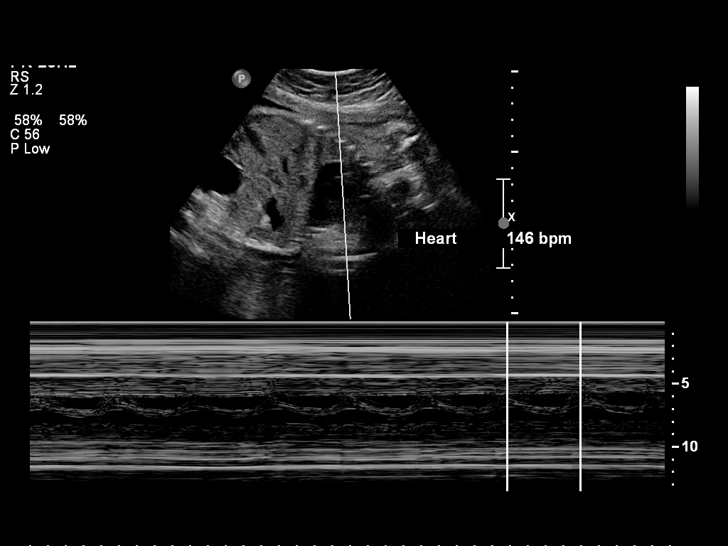
[im 6/51]
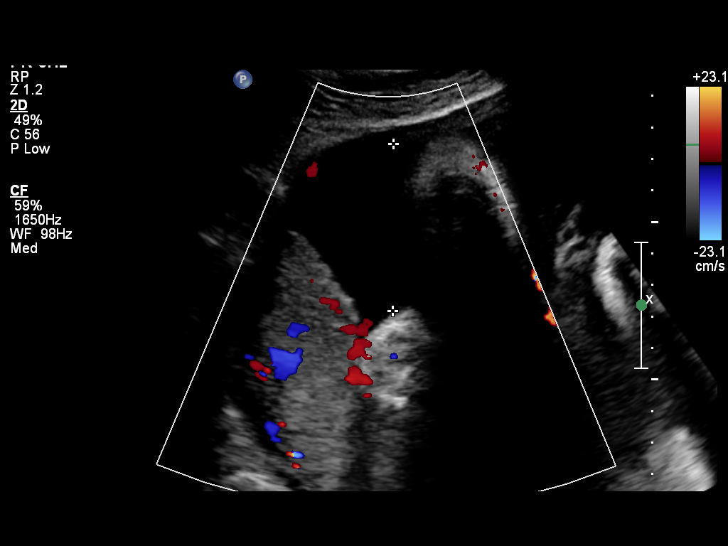
[im 10/51]
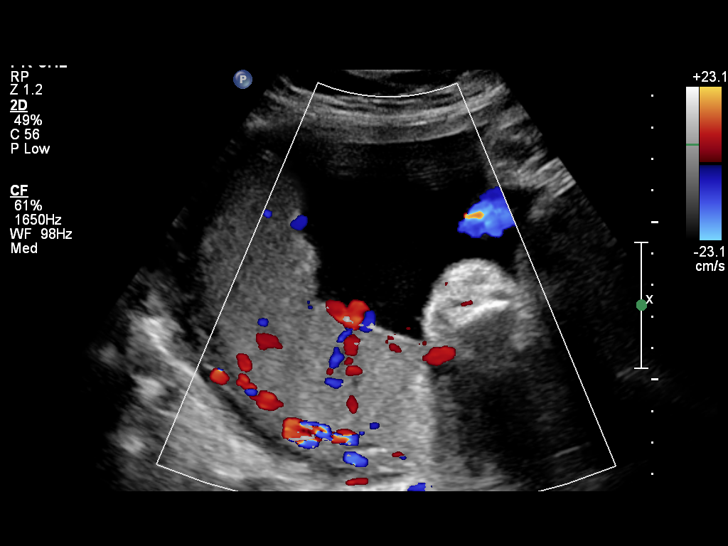
[im 15/51]
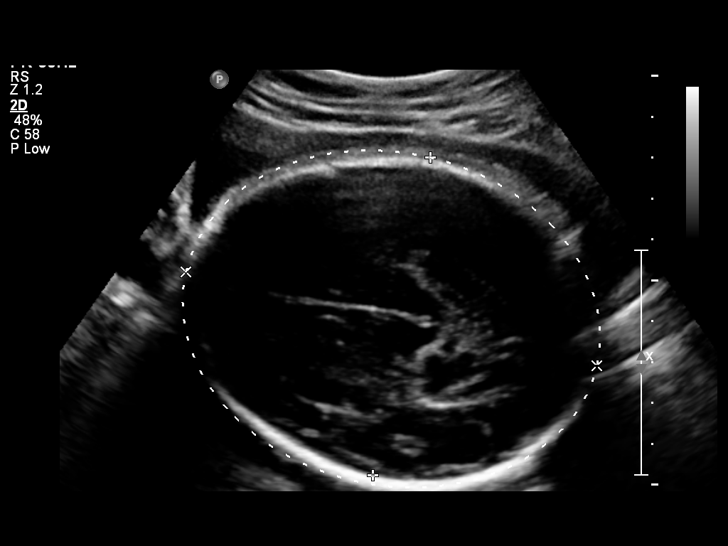
[im 19/51]
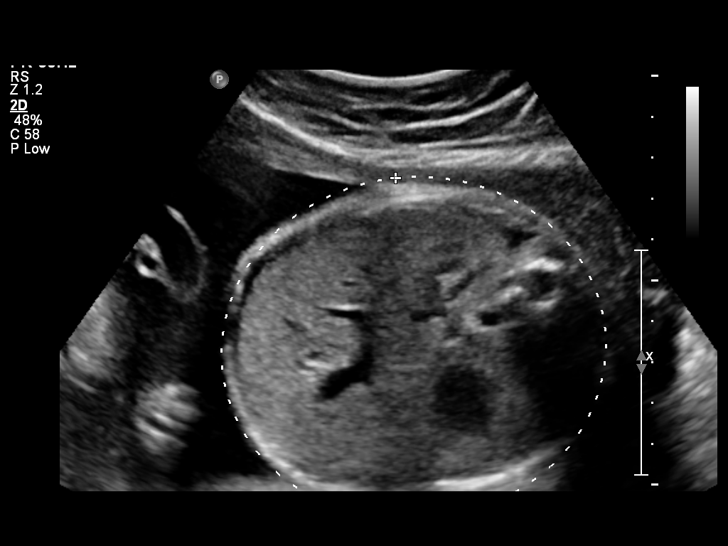
[im 23/51]
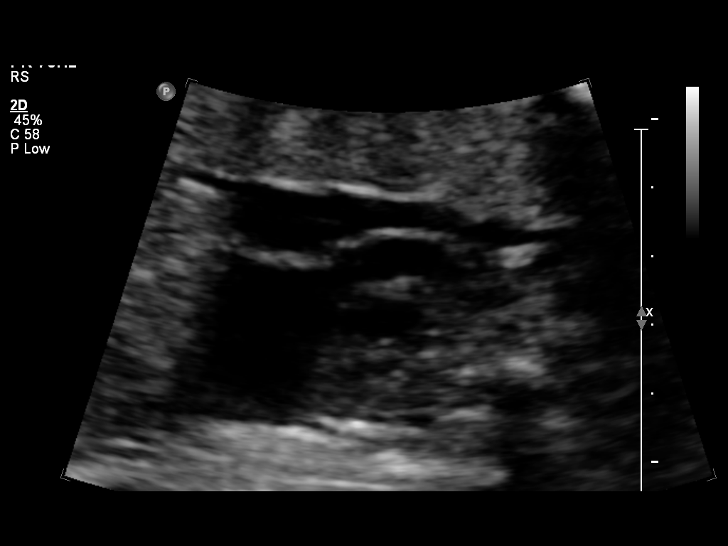
[im 28/51]
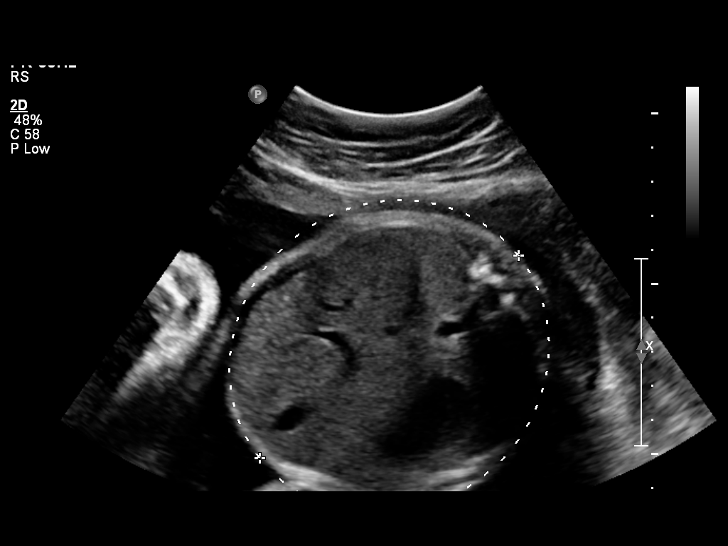
[im 32/51]
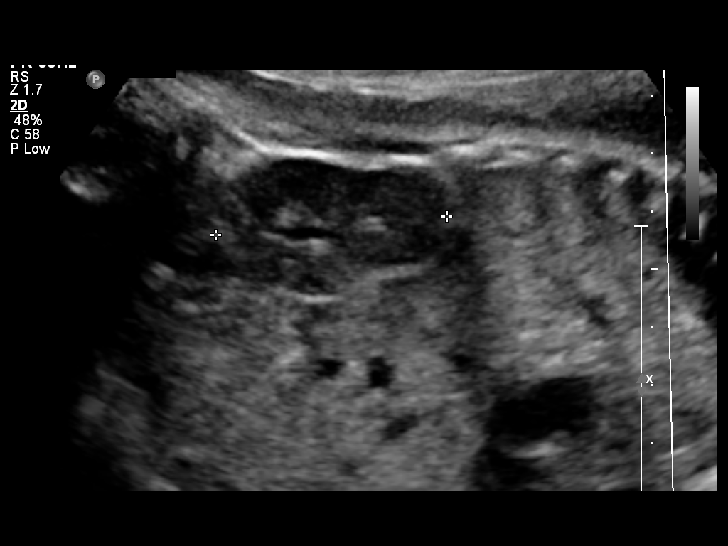
[im 36/51]
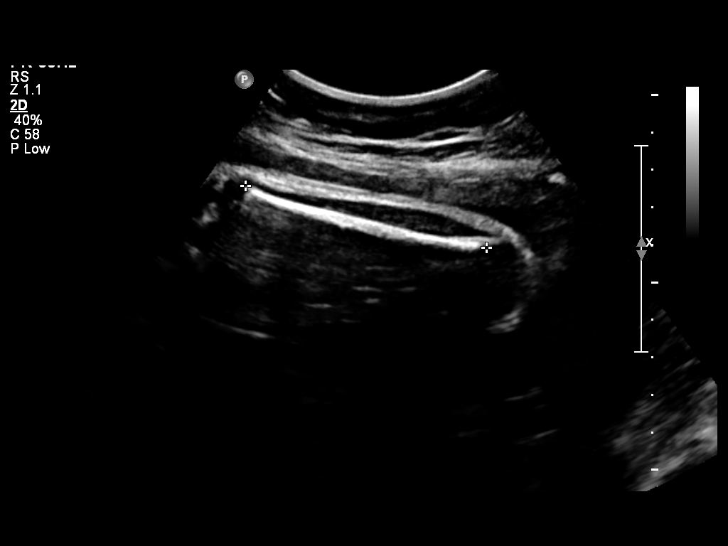
[im 41/51]
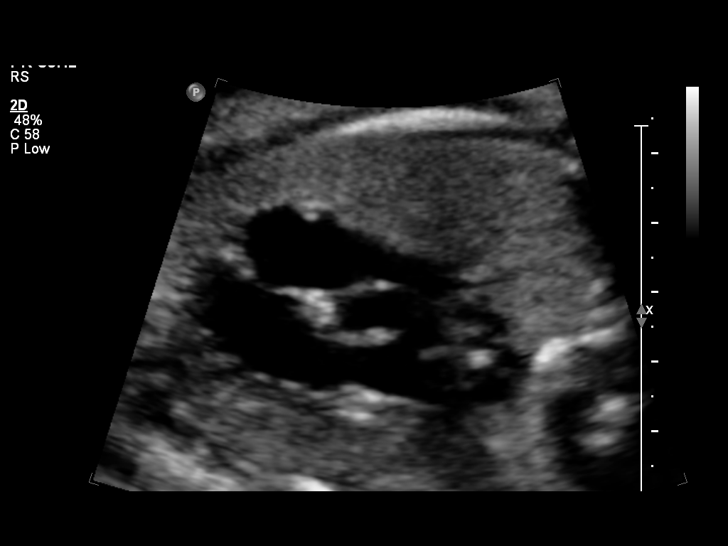
[im 45/51]
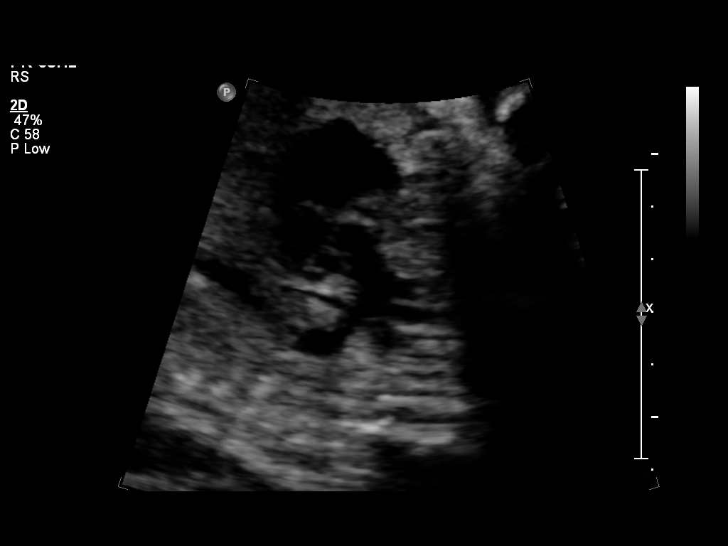
[im 49/51]
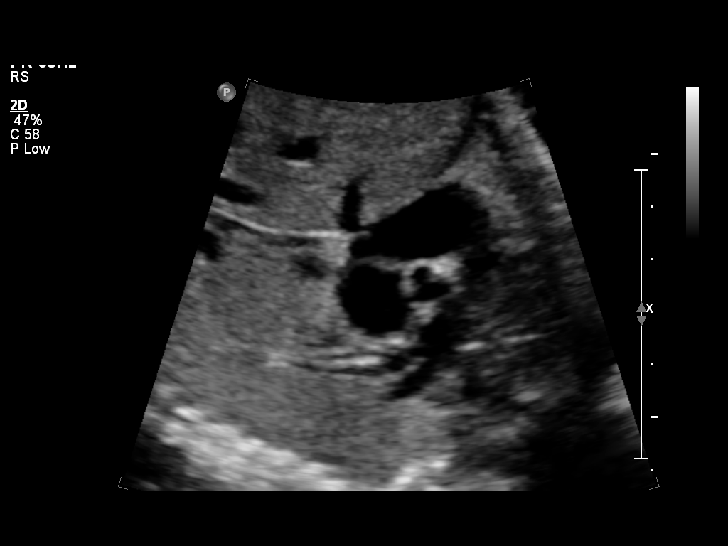

[12 of 28 positions shown; findings below may reference images not displayed]

OBSTETRICS REPORT
                      (Signed Final 09/21/2013 [DATE])

Service(s) Provided

 US OB FOLLOW UP                                       76816.1
Indications

 [facility] Prenatal Care
 Follow-up incomplete fetal anatomic evaluation
Fetal Evaluation

 Num Of Fetuses:    1
 Fetal Heart Rate:  146                          bpm
 Cardiac Activity:  Observed
 Presentation:      Cephalic
 Placenta:          Posterior, above cervical
                    os
 P. Cord            Visualized, central
 Insertion:

 Amniotic Fluid
 AFI FV:      Subjectively within normal limits
 AFI Sum:     17.32   cm       63  %Tile     Larg Pckt:    5.28  cm
 RUQ:   3.53    cm   RLQ:    3.31   cm    LUQ:   5.28    cm   LLQ:    5.2    cm
Biometry

 BPD:     78.5  mm     G. Age:  31w 4d                CI:        74.13   70 - 86
                                                      FL/HC:      22.5   19.1 -

 HC:     289.5  mm     G. Age:  31w 6d       13  %    HC/AC:      1.02   0.96 -

 AC:     284.2  mm     G. Age:  32w 3d       62  %    FL/BPD:     82.8   71 - 87
 FL:        65  mm     G. Age:  33w 4d       77  %    FL/AC:      22.9   20 - 24

 Est. FW:    4799  gm      4 lb 7 oz     68  %
Gestational Age

 LMP:           32w 0d        Date:  02/09/13                 EDD:   11/16/13
 U/S Today:     32w 3d                                        EDD:   11/13/13
 Best:          32w 0d     Det. By:  LMP  (02/09/13)          EDD:   11/16/13
Anatomy

 Cranium:          Appears normal         Aortic Arch:      Appears normal
 Fetal Cavum:      Appears normal         Ductal Arch:      Appears normal
 Ventricles:       Appears normal         Diaphragm:        Appears normal
 Choroid Plexus:   Previously seen        Stomach:          Appears normal, left
                                                            sided
 Cerebellum:       Previously seen        Abdomen:          Appears normal
 Posterior Fossa:  Previously seen        Abdominal Wall:   Previously seen
 Nuchal Fold:      Not applicable (>20    Cord Vessels:     Previously seen
                   wks GA)
 Face:             Profile appears        Kidneys:          Appear normal
                   normal
 Lips:             Appears normal         Bladder:          Appears normal
 Heart:            Appears normal         Spine:            Previously seen
                   (4CH, axis, and
                   situs)
 RVOT:             Appears normal         Lower             Previously seen
                                          Extremities:
 LVOT:             Appears normal         Upper             Previously seen
                                          Extremities:

 Other:  Fetus appears to be a female. Heels prev. visualized. Technically
         difficult due to fetal position.
Cervix Uterus Adnexa

 Cervix:       Not visualized (advanced GA >30wks)
Impression

 Single IUP at 32 0/7 weeks
 Limited prenatal care
 Fetal growth appears appropriate (68%tile)
 No dysmorphic features
 Normal amniotic fluid volume
Recommendations

 Today's images effectively complete fetal survey.  Follow up
 ultrasounds as clinically indicated.

## 2016-07-28 NOTE — L&D Delivery Note (Addendum)
VAGINAL DELIVERY NOTE:  Date of Delivery: 12/28/2016 Primary OBJonathon Bellows: [facility]  Gestational Age/EDD: 8790w2d 12/26/2016, Date entered prior to episode creation Antepartum complications: Hepatitis needlestick (Exposure to Hep C) during pregnancy being followed by employee health Attending Physician: CJones, CNM Delivery Type: spontaneous vaginal delivery  Anesthesia: local Laceration: 1st degree Episiotomy: none Placenta: spontaneous Intrapartum complications: Variability decels and fetal bradycardia with the caput sitting on the perineum for about 10 mins with pt pushing briefly and then stopping and then unable to control pt with her pushing. Estimated Blood Loss: 100 mls GBS: Neg Procedure Details: Pt arrived at 5/100%/vtx-2 and progressed quickly through labor feeling the urge to push but, unable to push effectively due to pt screaming and not working with the contractions. FHR in the 60's with mod variability and pt pushing but, only able to push for 3 or 5 secs and then stopping. Fetal scalp stim with pos results. Unable to consider vacuum as head was almost to the perineal outlet. With each push, pt would rear back and fight the urge and push for about 3 secs with the head making progress and then continuing to sit on the perineum. Nsy ready for delivery with 2 RN;s and NICU nurse readilty available. After 10 mins of sitting on the perineum, Vtx delivered in straight OA position with CAN x 1 loose reduced, then restituted to LOA and Ant and post shoulder and body del at 0454 and to mom'a abd with strong cry, +baby trying to nurse immediately at the pt's Lt breast on his own, alert, 3 VC noted. CCx 2 after delayed cord clamping and cut per dad. Baby for skin to skin and doing well. SDOP intact at 0459. FF and lochia mod. There was a perineal small superficial tear that looked like an abrasion but, due to the area at the fourchette, a local 1% xylocaine was used for 2 interupted sutures. Ff and lochia mod,  no clots. VSS. Hemostasis achieved. Needle count (2) and sponge count count 5 with 1 lap and correct.  Baby: Liveborn: Boy  Apgars (8,9), weight  #, oz, baby name not available per mom yet  Sharee Pimplearon W. Diya Gervasi, RN, MSN, CNM, FNP

## 2016-11-25 ENCOUNTER — Emergency Department
Admission: EM | Admit: 2016-11-25 | Discharge: 2016-11-25 | Disposition: A | Discharge status: Home / Self Care | Payer: Worker's Compensation | Attending: Emergency Medicine | Admitting: Emergency Medicine

## 2016-11-25 ENCOUNTER — Encounter: Payer: Self-pay | Admitting: Emergency Medicine

## 2016-11-25 DIAGNOSIS — S6992XA Unspecified injury of left wrist, hand and finger(s), initial encounter: Secondary | ICD-10-CM | POA: Insufficient documentation

## 2016-11-25 DIAGNOSIS — W273XXA Contact with needle (sewing), initial encounter: Secondary | ICD-10-CM | POA: Insufficient documentation

## 2016-11-25 DIAGNOSIS — Y990 Civilian activity done for income or pay: Secondary | ICD-10-CM | POA: Insufficient documentation

## 2016-11-25 DIAGNOSIS — Z791 Long term (current) use of non-steroidal anti-inflammatories (NSAID): Secondary | ICD-10-CM | POA: Insufficient documentation

## 2016-11-25 DIAGNOSIS — Y9389 Activity, other specified: Secondary | ICD-10-CM | POA: Diagnosis not present

## 2016-11-25 DIAGNOSIS — Y92129 Unspecified place in nursing home as the place of occurrence of the external cause: Secondary | ICD-10-CM | POA: Insufficient documentation

## 2016-11-25 DIAGNOSIS — O9A213 Injury, poisoning and certain other consequences of external causes complicating pregnancy, third trimester: Secondary | ICD-10-CM | POA: Insufficient documentation

## 2016-11-25 DIAGNOSIS — X58XXXA Exposure to other specified factors, initial encounter: Secondary | ICD-10-CM

## 2016-11-25 DIAGNOSIS — Z3A36 36 weeks gestation of pregnancy: Secondary | ICD-10-CM | POA: Insufficient documentation

## 2016-11-25 NOTE — Discharge Instructions (Signed)
Please call the number provided for infectious diseases to arrange a follow-up appointment with Dr. Sampson Goon.

## 2016-11-25 NOTE — ED Triage Notes (Addendum)
Patient ambulatory to triage with steady gait, without difficulty or distress noted; pt reports placing syringe in sharps container and was stuck by needle just PTA to left index finger--immed attempted to milk finger and then rinsed and clensed with soap & water; employeed with [employer] (workers comp profile indicates on drug testing required unless requested--called Sabino Niemann [phone], supervisor, & verified no testing required); pt currently [redacted]wks pregnant, pt at [facility]; pt indicates source has known Hep C; charge nurse notified of pt

## 2016-11-25 NOTE — ED Provider Notes (Signed)
[facility] Emergency Department Provider Note  Time seen: 8:44 PM  I have reviewed the triage vital signs and the nursing notes.   HISTORY  Chief Complaint Body Fluid Exposure    HPI Jamie Barker is a 26 y.o. female [redacted] weeks pregnant who presents to the emergency department after a needlestick just prior to arrival. 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 After injecting a patient with insulin she accidentally stuck her left index finger. Per report the origin patient has a history of hepatitis C. Patient denies any other symptoms at this time. Denies fever, headache, nausea vomiting.  Past Medical History:  Diagnosis Date  . Anemia   . FH: hypertension   . History of positive PPD, untreated   . Late prenatal care   . Rh negative status during pregnancy   . UTI (urinary tract infection)     Patient Active Problem List   Diagnosis Date Noted  . Active labor 11/20/2013  . Pregnancy, post-term 10/20/2011  . Pregnancy as incidental finding 10/18/2011    Past Surgical History:  Procedure Laterality Date  . WISDOM TOOTH EXTRACTION      Prior to Admission medications   Medication Sig Start Date End Date Taking? Authorizing Provider  ibuprofen (ADVIL,MOTRIN) 800 MG tablet Take 1 tablet (800 mg total) by mouth every 8 (eight) hours as needed. 11/22/13   Armando Reichert, CNM  Prenatal Vit-Fe Fumarate-FA (PRENATAL MULTIVITAMIN) TABS Take 1 tablet by mouth daily.    Historical Provider, MD    No Known Allergies  Family History  Problem Relation Age of Onset  . Anemia Mother   . Hypertension Father   . Consanguinity Maternal Aunt   . Consanguinity Maternal Uncle   . Consanguinity Paternal Aunt   . Consanguinity Paternal Uncle   . Consanguinity Cousin     Social History Social History  Substance Use Topics  . Smoking status: Never Smoker  . Smokeless tobacco: Never  Used  . Alcohol use No    Review of Systems Constitutional: Negative for fever. Gastrointestinal: Negative for abdominal pain, vomiting Skin: Needle stick to left index finger Neurological: Negative for headache All other ROS negative  ____________________________________________   PHYSICAL EXAM:  VITAL SIGNS: ED Triage Vitals  Enc Vitals Group     BP 11/25/16 2001 (!) 146/82     Pulse Rate 11/25/16 2001 81     Resp 11/25/16 2001 18     Temp 11/25/16 2001 98 F (36.7 C)     Temp Source 11/25/16 2001 Oral     SpO2 11/25/16 2001 99 %     Weight 11/25/16 2000 218 lb (98.9 kg)     Height 11/25/16 2000  (1.626 m)     Head Circumference --      Peak Flow --      Pain Score --      Pain Loc --      Pain Edu? --      Excl. in GC? --     Constitutional: Alert and oriented. Well appearing and in no distress. Eyes: Normal exam ENT   Head: Normocephalic and atraumatic   Mouth/Throat: Mucous membranes are moist. Cardiovascular: Normal rate, regular rhythm. No murmur Respiratory: Normal respiratory effort without tachypnea nor retractions. Breath sounds are clear  Gastrointestinal: Gravid uterus. Nontender. Musculoskeletal: Small area of needlestick visualized to left index finger, hemostatic. No surrounding erythema. Neurologic:  Normal speech  and language. No gross focal neurologic deficits Skin:  Skin is warm, dry  Psychiatric: Mood and affect are normal.  ____________________________________________   INITIAL IMPRESSION / ASSESSMENT AND PLAN / ED COURSE  Pertinent labs & imaging results that were available during my care of the patient were reviewed by me and considered in my medical decision making (see chart for details).  Patient with a high risk needlestick given a known hepatitis C diagnosis in the origin patient. I discussed the patient with Dr. Sampson Goon of infectious diseases who recommends not starting prophylaxis medications given the patient is [redacted]  week gestation. I provided information for the nursing facility to Dr. Sampson Goon who states he will contact the facility to try to obtain viral loads from the source patient. We will hold off on prophylaxis medications per ID, the patient will follow-up with infectious diseases for further treatment and following. We will send viral panels from the emergency department.  ____________________________________________   FINAL CLINICAL IMPRESSION(S) / ED DIAGNOSES  Needlestick    Minna Antis, MD 11/25/16 2049

## 2016-11-26 ENCOUNTER — Telehealth: Payer: Self-pay | Admitting: Infectious Diseases

## 2016-11-26 NOTE — Telephone Encounter (Signed)
-----   Message from Minna Antis, MD sent at 11/25/2016  8:38 PM EDT ----- Hi Dr. Sampson Goon,  Thank you for your help with this patient. I attempted to contact the patient's nursing supervisor but was unable to do so to get further information regarding the history of the patient from which the needle stick originated (per report known hepatitis C). The phone number to the facility is: Sabino Niemann [phone] (supervisor) We have sent the viral/hepatology panel from the emergency department. We will hold off on any prophylaxis at this time. Thank you again for help with this patient. Thank you,   Minna Antis [facility] ED)

## 2016-11-27 DIAGNOSIS — Z205 Contact with and (suspected) exposure to viral hepatitis: Secondary | ICD-10-CM | POA: Insufficient documentation

## 2016-11-28 ENCOUNTER — Telehealth: Payer: Self-pay | Admitting: Infectious Diseases

## 2016-11-28 NOTE — Telephone Encounter (Signed)
-----   Message from Kevin Paduchowski, MD sent at 11/25/2016  8:38 PM EDT ----- Hi Dr. Lauree Yurick,  Thank you for your help with this patient. I attempted to contact the patient's nursing supervisor but was unable to do so to get further information regarding the history of the patient from which the needle stick originated (per report known hepatitis C). The phone number to the facility is: Cynthia Poole [phone] (supervisor) We have sent the viral/hepatology panel from the emergency department. We will hold off on any prophylaxis at this time. Thank you again for help with this patient. Thank you,   Kevin Paduchowski ([facility] ED) 

## 2016-11-28 NOTE — Telephone Encounter (Signed)
The facility could not find any results of hepatitis testing on the source patient.  I have ordered a hepatitis C antibody as well as PCR and an HIV antibody.

## 2016-11-28 NOTE — Telephone Encounter (Signed)
I had [facility] several times but finally reached Cynbthia [place]Poole today.  She will check into the source patients labs and call me back

## 2016-11-28 NOTE — Telephone Encounter (Signed)
-----   Message from Minna AntisKevin Paduchowski, MD sent at 11/25/2016  8:38 PM EDT ----- Hi Dr. Sampson GoonFitzgerald,  Thank you for your help with this patient. I attempted to contact the patient's nursing supervisor but was unable to do so to get further information regarding the history of the patient from which the needle stick originated (per report known hepatitis C). The phone number to the facility is: Sabino NiemannCynthia Poole [phone] (supervisor) We have sent the viral/hepatology panel from the emergency department. We will hold off on any prophylaxis at this time. Thank you again for help with this patient. Thank you,   Minna AntisKevin Paduchowski [facility] ED)

## 2016-12-28 ENCOUNTER — Inpatient Hospital Stay
Admission: EM | Admit: 2016-12-28 | Discharge: 2016-12-30 | DRG: 775 | Disposition: A | Discharge status: Home / Self Care | Payer: Medicaid Other | Attending: Obstetrics and Gynecology | Admitting: Obstetrics and Gynecology

## 2016-12-28 DIAGNOSIS — Z3A40 40 weeks gestation of pregnancy: Secondary | ICD-10-CM

## 2016-12-28 DIAGNOSIS — Z3493 Encounter for supervision of normal pregnancy, unspecified, third trimester: Secondary | ICD-10-CM | POA: Diagnosis present

## 2016-12-28 DIAGNOSIS — O26893 Other specified pregnancy related conditions, third trimester: Secondary | ICD-10-CM | POA: Diagnosis present

## 2016-12-28 DIAGNOSIS — Z6791 Unspecified blood type, Rh negative: Secondary | ICD-10-CM | POA: Diagnosis not present

## 2016-12-28 DIAGNOSIS — E669 Obesity, unspecified: Secondary | ICD-10-CM | POA: Diagnosis present

## 2016-12-28 DIAGNOSIS — O99214 Obesity complicating childbirth: Secondary | ICD-10-CM | POA: Diagnosis present

## 2016-12-28 LAB — CBC
HCT: 33.2 % — ABNORMAL LOW (ref 35.0–47.0)
HEMOGLOBIN: 11.1 g/dL — AB (ref 12.0–16.0)
MCH: 26.4 pg (ref 26.0–34.0)
MCHC: 33.4 g/dL (ref 32.0–36.0)
MCV: 79 fL — ABNORMAL LOW (ref 80.0–100.0)
Platelets: 307 10*3/uL (ref 150–440)
RBC: 4.21 MIL/uL (ref 3.80–5.20)
RDW: 16.7 % — ABNORMAL HIGH (ref 11.5–14.5)
WBC: 10.7 10*3/uL (ref 3.6–11.0)

## 2016-12-28 LAB — RAPID HIV SCREEN (HIV 1/2 AB+AG)
HIV 1/2 Antibodies: NONREACTIVE
HIV-1 P24 ANTIGEN - HIV24: NONREACTIVE

## 2016-12-28 MED ORDER — OXYCODONE HCL 5 MG PO TABS
5.0000 mg | ORAL_TABLET | ORAL | Status: DC | PRN
  Administered 2016-12-28: 5 mg via ORAL
  Filled 2016-12-28 (×2): qty 1

## 2016-12-28 MED ORDER — ZOLPIDEM TARTRATE 5 MG PO TABS: 5.0000 mg | ORAL_TABLET | Freq: Every evening | ORAL | Status: DC | PRN

## 2016-12-28 MED ORDER — SODIUM CHLORIDE 0.9% FLUSH: 3.0000 mL | INTRAVENOUS | Status: DC | PRN

## 2016-12-28 MED ORDER — ONDANSETRON HCL 4 MG/2ML IJ SOLN
4.0000 mg | Freq: Four times a day (QID) | INTRAMUSCULAR | Status: DC | PRN
  Administered 2016-12-28: 4 mg via INTRAVENOUS

## 2016-12-28 MED ORDER — SOD CITRATE-CITRIC ACID 500-334 MG/5ML PO SOLN
30.0000 mL | ORAL | Status: DC | PRN
  Filled 2016-12-28: qty 30

## 2016-12-28 MED ORDER — SODIUM CHLORIDE 0.9% FLUSH: 3.0000 mL | Freq: Two times a day (BID) | INTRAVENOUS | Status: DC

## 2016-12-28 MED ORDER — OXYTOCIN 40 UNITS IN LACTATED RINGERS INFUSION - SIMPLE MED
INTRAVENOUS | Status: AC
  Filled 2016-12-28: qty 1000

## 2016-12-28 MED ORDER — LIDOCAINE HCL (PF) 1 % IJ SOLN: 30.0000 mL | INTRAMUSCULAR | Status: DC | PRN

## 2016-12-28 MED ORDER — LACTATED RINGERS IV SOLN: INTRAVENOUS | Status: DC

## 2016-12-28 MED ORDER — ONDANSETRON HCL 4 MG/2ML IJ SOLN
INTRAMUSCULAR | Status: AC
  Filled 2016-12-28: qty 2

## 2016-12-28 MED ORDER — BISACODYL 10 MG RE SUPP: 10.0000 mg | Freq: Every day | RECTAL | Status: DC | PRN

## 2016-12-28 MED ORDER — ONDANSETRON HCL 4 MG PO TABS: 4.0000 mg | ORAL_TABLET | ORAL | Status: DC | PRN

## 2016-12-28 MED ORDER — ONDANSETRON HCL 4 MG/2ML IJ SOLN: 4.0000 mg | INTRAMUSCULAR | Status: DC | PRN

## 2016-12-28 MED ORDER — BUTORPHANOL TARTRATE 1 MG/ML IJ SOLN: 1.0000 mg | INTRAMUSCULAR | Status: DC | PRN

## 2016-12-28 MED ORDER — AMMONIA AROMATIC IN INHA
RESPIRATORY_TRACT | Status: AC
  Filled 2016-12-28: qty 10

## 2016-12-28 MED ORDER — ACETAMINOPHEN 325 MG PO TABS: 650.0000 mg | ORAL_TABLET | ORAL | Status: DC | PRN

## 2016-12-28 MED ORDER — IBUPROFEN 600 MG PO TABS
600.0000 mg | ORAL_TABLET | Freq: Four times a day (QID) | ORAL | Status: DC
  Administered 2016-12-28 – 2016-12-30 (×10): 600 mg via ORAL
  Filled 2016-12-28 (×10): qty 1

## 2016-12-28 MED ORDER — SODIUM CHLORIDE 0.9 % IV SOLN: 250.0000 mL | INTRAVENOUS | Status: DC | PRN

## 2016-12-28 MED ORDER — OXYTOCIN BOLUS FROM INFUSION: 500.0000 mL | Freq: Once | INTRAVENOUS | Status: DC

## 2016-12-28 MED ORDER — SIMETHICONE 80 MG PO CHEW
80.0000 mg | CHEWABLE_TABLET | ORAL | Status: DC | PRN
  Administered 2016-12-29: 80 mg via ORAL
  Filled 2016-12-28: qty 1

## 2016-12-28 MED ORDER — WITCH HAZEL-GLYCERIN EX PADS: 1.0000 "application " | MEDICATED_PAD | CUTANEOUS | Status: DC | PRN

## 2016-12-28 MED ORDER — BENZOCAINE-MENTHOL 20-0.5 % EX AERO
1.0000 "application " | INHALATION_SPRAY | CUTANEOUS | Status: DC | PRN
  Administered 2016-12-30: 1 via TOPICAL
  Filled 2016-12-28: qty 56

## 2016-12-28 MED ORDER — MISOPROSTOL 200 MCG PO TABS
ORAL_TABLET | ORAL | Status: AC
  Filled 2016-12-28: qty 4

## 2016-12-28 MED ORDER — TETANUS-DIPHTH-ACELL PERTUSSIS 5-2.5-18.5 LF-MCG/0.5 IM SUSP: 0.5000 mL | Freq: Once | INTRAMUSCULAR | Status: DC

## 2016-12-28 MED ORDER — COCONUT OIL OIL: 1.0000 "application " | TOPICAL_OIL | Status: DC | PRN

## 2016-12-28 MED ORDER — OXYTOCIN 40 UNITS IN LACTATED RINGERS INFUSION - SIMPLE MED
2.5000 [IU]/h | INTRAVENOUS | Status: DC
  Administered 2016-12-28: 2.5 [IU]/h via INTRAVENOUS

## 2016-12-28 MED ORDER — LACTATED RINGERS IV SOLN: 500.0000 mL | INTRAVENOUS | Status: DC | PRN

## 2016-12-28 MED ORDER — SENNOSIDES-DOCUSATE SODIUM 8.6-50 MG PO TABS
2.0000 | ORAL_TABLET | ORAL | Status: DC
  Administered 2016-12-29: 2 via ORAL
  Filled 2016-12-28 (×3): qty 2

## 2016-12-28 MED ORDER — ONDANSETRON HCL 4 MG/2ML IJ SOLN: 4.0000 mg | Freq: Four times a day (QID) | INTRAMUSCULAR | Status: DC | PRN

## 2016-12-28 MED ORDER — PRENATAL MULTIVITAMIN CH
1.0000 | ORAL_TABLET | Freq: Every day | ORAL | Status: DC
  Administered 2016-12-28 – 2016-12-30 (×3): 1 via ORAL
  Filled 2016-12-28 (×3): qty 1

## 2016-12-28 MED ORDER — DIBUCAINE 1 % RE OINT: 1.0000 "application " | TOPICAL_OINTMENT | RECTAL | Status: DC | PRN

## 2016-12-28 MED ORDER — DIPHENHYDRAMINE HCL 25 MG PO CAPS: 25.0000 mg | ORAL_CAPSULE | Freq: Four times a day (QID) | ORAL | Status: DC | PRN

## 2016-12-28 MED ORDER — OXYTOCIN 10 UNIT/ML IJ SOLN
INTRAMUSCULAR | Status: AC
  Filled 2016-12-28: qty 2

## 2016-12-28 MED ORDER — FLEET ENEMA 7-19 GM/118ML RE ENEM: 1.0000 | ENEMA | Freq: Every day | RECTAL | Status: DC | PRN

## 2016-12-28 MED ORDER — MEASLES, MUMPS & RUBELLA VAC ~~LOC~~ INJ
0.5000 mL | INJECTION | Freq: Once | SUBCUTANEOUS | Status: DC
  Filled 2016-12-28: qty 0.5

## 2016-12-28 MED ORDER — LIDOCAINE HCL (PF) 1 % IJ SOLN
INTRAMUSCULAR | Status: AC
  Filled 2016-12-28: qty 30

## 2016-12-28 NOTE — Discharge Instructions (Signed)

## 2016-12-28 NOTE — H&P (Addendum)
HISTORY AND PHYSICAL  HISTORY OF PRESENT ILLNESS: Ms. Jamie Barker is a 26 y.o. G3P2002 at 3150w2d by LMP of 03/21/16 with EDD of 12/26/16 with US done at 24 4/7  with a 1 week 6 day  Discrepancy due to S>D but, the date was not changed.  Pregnancy complicated by Obesity  presenting for active labor. Pt also was stuck by Hepatitis C needle at [facility] on 11/25/16. Pt is being followed by [facility].   She has been having contractions Q 2 mins and has had leakage of fluid starting at 0305, no vaginal bleeding, or decreased fetal movement.     REVIEW OF SYSTEMS: A complete review of systems was performed and was specifically negative for headache, changes in vision, RUQ pain, shortness of breath, chest pain, lower extremity edema and dysuria.   HISTORY:  Past Medical History:  Diagnosis Date  . Anemia   . FH: hypertension   . History of positive PPD, untreated   . Late prenatal care   . Rh negative status during pregnancy   . UTI (urinary tract infection)     Past Surgical History:  Procedure Laterality Date  . WISDOM TOOTH EXTRACTION      No current facility-administered medications on file prior to encounter.    Current Outpatient Prescriptions on File Prior to Encounter  Medication Sig Dispense Refill  . ibuprofen (ADVIL,MOTRIN) 800 MG tablet Take 1 tablet (800 mg total) by mouth every 8 (eight) hours as needed. (Patient not taking: Reported on 12/28/2016) 30 tablet 0  . Prenatal Vit-Fe Fumarate-FA (PRENATAL MULTIVITAMIN) TABS Take 1 tablet by mouth daily.       No Known Allergies  OB History  Gravida Para Term Preterm AB Living  3 2 2  0 0 2  SAB TAB Ectopic Multiple Live Births  0 0 0 0 2    # Outcome Date GA Lbr Len/2nd Weight Sex Delivery Anes PTL Lv  3 Current           2 Term 11/20/13 4727w4d 04:08 / 00:14 7 lb 0.5 oz (3.19 kg) F Vag-Spont EPI  LIV  1 Term 10/19/11 6711w1d 17:09 / 02:46 7 lb 7.4 oz (3.385 kg) F Vag-Spont EPI  LIV      Gynecologic  History: History of Abnormal Pap Smear:  History of STI: Heapatitis C needle exposure  Social History  Substance Use Topics  . Smoking status: Never Smoker  . Smokeless tobacco: Never Used  . Alcohol use No    PHYSICAL EXAM:    GENERAL: NAD AAOx3 CHEST:CTAB no increased work of breathing CV:RRR no appreciable murmurs, rubs, gallops ABDOMEN: gravid, nontender, EFW 8#8oz by Le[place] EXTREMITIES:  Warm and well-perfused, nontender, nonedematous,  DTRs  clonus CERVIX: 7/100/vt-2  Vitals:   12/28/16 0422  BP: (!) 136/93  Pulse: 80  Temp: 97.5 F (36.4 C)  FHT:s baseline with variability, + accelerations and variable decels to 85-90  With mod variabiltity with pt sitting directly upright with legs dangling off both sides of be  Toco: q 2 mins, difficult to monitor due to pt position of comfort  DIAGNOSTIC STUDIES: No results for input(s): WBC, HGB, HCT, PLT, NA, K, CL, CO2, BUN, CREATININE, LABGLOM, GLUCOSE, CALCIUM, BILIDIR, ALKPHOS, AST, ALT, PROT, MG in the last 168 hours.  Invalid input(s): LABALB, UA  PRENATAL STUDIES:  Prenatal Labs:  MBT: ; Rubella immune, Varicella immune, HIV neg, RPR neg, Hep B neg, GC/CT neg, GBS , glucola  Last US 24 4/7wks,placenta above the  os, AF wnl, normal anatomy (S>D)  ASSESSMENT AND PLAN:  1. Fetal Well being  - Fetal Tracing Variable decels with mod variability - Ultrasound:  reviewed, as above - Group B Streptococcus:  - Presentation:vtx confirmed by RN  2. Routine OB: - Prenatal labs reviewed, as above - Rh Pos  3. Active Labor:  -  Contractions: external toco in place -  Pelvis proven to 7#7oz -  Plan:Admit for delivery  4. Post Partum Planning: - Infant feeding: Breast - Contraception: LARC poss

## 2016-12-28 NOTE — Discharge Summary (Addendum)
Obstetrical Discharge Summary  Patient Name: Jamie Barker DOB: [birth year] MRN: [id number]  Date of Admission: 12/28/2016 Date of Delivery: 12/28/16 Delivered by: Harden MoJones, CNM Date of Discharge: 12/30/16 [facility]  [facility] Est[facility] Date of Delivery: 12/26/16 Gestational Age at Delivery: 6523w2d   Antepartum complications: Needlestick exposure to Hepatitis C in pregnancy with no known conversion to Hep C from Work labs.  Admitting Diagnosis: Term pregnancy in labor  Secondary Diagnosis: Patient Active Problem List   Diagnosis Date Noted  . Indication for care in labor or delivery 12/28/2016  . Active labor 11/20/2013  . Pregnancy, post-term 10/20/2011  . Pregnancy as incidental finding 10/18/2011    Augmentation: None Complications: None Intrapartum complications/course: GBS neg, pt arrived at 5 cms and progressed fairly fast to complete. FHR in the 60's for approx 15 mins with pushing with caput on the perineum for a prolonged time. At delivery, baby crying and alert and Apgars exceptional.  Date of Delivery: 12/28/2016 Delivered NW:GNFAOBy:Caron Yetta BarreJones, CNM Delivery Type: spontaneous vaginal delivery Anesthesia: 1% xylocaine for repair  Placenta: sponatneous Laceration: Abrasion of the fourchette requiring 2 sutures for support  Episiotomy: none Newborn Data: Live born female  Birth Weight:   APGAR: 8, 9 Plans to breast and bottlefeed    Postpartum Procedures: RHOGAM   Post partum course: uncomplicated Patient had an uncomplicated postpartum course.  By time of discharge on PPD#1, her pain was controlled on oral pain medications; she had appropriate lochia and was ambulating, voiding without difficulty and tolerating regular diet.  She was deemed stable for discharge to home.     Discharge Physical Exam: 12/29/16 BP 123/69 (BP Location: Right Arm)   Pulse 74   Temp 98 F (36.7 C) (Oral)   Resp 18   SpO2 100%   Breastfeeding? Unknown    General: NAD CV: RRR Pulm: CTABL, nl effort ABD: s/nd/nt, fundus firm and below the umbilicus Lochia: moderate Incision: c/d/i DVT Evaluation: LE non-ttp, no evidence of DVT on exam.  Hemoglobin  Date Value Ref Range Status  12/29/2016 10.1 (L) 12.0 - 16.0 g/dL Final   HCT  Date Value Ref Range Status  12/29/2016 31.4 (L) 35.0 - 47.0 % Final     Disposition: stable, discharge to home. Baby Feeding: breastmilk &formula Baby Disposition: home with mom  Rh Immune globulin given:Given  Rubella vaccine given: Immune Contraception:OCP'smicronor Prenatal Labs:     Plan:  Jamie Barker was discharged to home in good condition. [facility]  Discharge Medications: Allergies as of 12/30/2016   No Known Allergies     Medication List    TAKE these medications   benzocaine-Menthol 20-0.5 % Aero Commonly known as:  DERMOPLAST Apply 1 application topically as needed for irritation (perineal discomfort).   ibuprofen 600 MG tablet Commonly known as:  ADVIL,MOTRIN Take 1 tablet (600 mg total) by mouth every 6 (six) hours. What changed:  medication strength  how much to take  when to take this  reasons to take this   norethindrone 0.35 MG tablet Commonly known as:  ORTHO MICRONOR Take 1 tablet (0.35 mg total) by mouth daily.   prenatal multivitamin Tabs tablet Take 1 tablet by mouth daily.       Follow-up Information    Sharee PimpleJones, Caron W, CNM Follow up in 6 week(s).   Specialty:  Obstetrics and Gynecology Why:  PP care  Contact information: [address] [facility] [place] [id number] [phone]  Signed: Thjomas J Schermerhorn MD  12/29/16

## 2016-12-29 LAB — RPR: RPR Ser Ql: NONREACTIVE

## 2016-12-29 LAB — TYPE AND SCREEN
ABO/RH(D): O NEG
ANTIBODY SCREEN: POSITIVE

## 2016-12-29 LAB — CBC
HCT: 31.4 % — ABNORMAL LOW (ref 35.0–47.0)
Hemoglobin: 10.1 g/dL — ABNORMAL LOW (ref 12.0–16.0)
MCH: 25.3 pg — ABNORMAL LOW (ref 26.0–34.0)
MCHC: 32.3 g/dL (ref 32.0–36.0)
MCV: 78.3 fL — ABNORMAL LOW (ref 80.0–100.0)
PLATELETS: 308 10*3/uL (ref 150–440)
RBC: 4.01 MIL/uL (ref 3.80–5.20)
RDW: 17 % — AB (ref 11.5–14.5)
WBC: 12.6 10*3/uL — ABNORMAL HIGH (ref 3.6–11.0)

## 2016-12-29 LAB — FETAL SCREEN: Fetal Screen: NEGATIVE

## 2016-12-29 MED ORDER — RHO D IMMUNE GLOBULIN 1500 UNIT/2ML IJ SOSY
300.0000 ug | PREFILLED_SYRINGE | Freq: Once | INTRAMUSCULAR | Status: AC
  Administered 2016-12-29: 300 ug via INTRAMUSCULAR
  Filled 2016-12-29: qty 2

## 2016-12-29 MED ORDER — BENZOCAINE-MENTHOL 20-0.5 % EX AERO
1.0000 | INHALATION_SPRAY | CUTANEOUS | 1 refills | Status: DC | PRN
Start: 2016-12-29 — End: 2018-06-10

## 2016-12-29 MED ORDER — NORETHINDRONE 0.35 MG PO TABS: 1.0000 | ORAL_TABLET | Freq: Every day | ORAL | 11 refills | Status: DC

## 2016-12-29 MED ORDER — IBUPROFEN 600 MG PO TABS: 600.0000 mg | ORAL_TABLET | Freq: Four times a day (QID) | ORAL | 0 refills | Status: DC

## 2016-12-29 NOTE — Progress Notes (Signed)
Patient ID: Jamie Barker, female   DOB: 07-Jun-1991, 26 y.o.   MRN: [id number] After further consideration the pt has elected to stay until tomorrow

## 2016-12-29 NOTE — Progress Notes (Signed)
Called to room by Pt. She requested bottle stating she always intended to, " do both breast and bottle.' Instructed Mom in the need to put Infnat to Breast or Pump so tht her Milk would come in and also that her production is based on demand. Pt. V/O and reiterated that she wanted to provide Formula for her Infant. Similac was provided as per Pt. Request.

## 2016-12-29 NOTE — Lactation Note (Signed)
This note was copied from a baby's chart. Lactation Consultation Note  Patient Name: Boy Clance Bollarjette Sitar Today's Date: 12/29/2016     Maternal Data    Feeding    LATCH Score/Interventions                      Lactation Tools Discussed/Used     Consult Status  pt.states that she wants to pursue formula feeding and no longer wants to put the baby to breast. LC instructed mom on how to care for her breasts since some nursing was initiated.     Burnadette PeterJaniya M Female Minish 12/29/2016, 1:58 PM

## 2016-12-30 LAB — RHOGAM INJECTION: UNIT DIVISION: 0

## 2016-12-30 NOTE — Progress Notes (Signed)
Mother viewed the DVD, "The Period of Purple Cry" and copy given to her for discharge home for other family members.

## 2017-07-28 NOTE — L&D Delivery Note (Addendum)
Patient is 27 y.o. [id number] 4277w6d admitted PROM. S/p IOL with Pitocin. SROM at 1000.  [facility] 5563[facility]2d).  Delivery Note At 11:47 AM a viable female was delivered via Vaginal, Spontaneous (Presentation: LOA with loose nuchal cord and hand presentation).  APGAR: 8, 9; weight  .   Placenta status: spontaneous, intact .  Cord: 3 vessel with the following complications: none.  Cord pH: not drawn   Anesthesia:  Epidural  Episiotomy: None Lacerations: 1st degree;Labial Suture Repair: 2.0 vicryl rapide Est. Blood Loss (mL): 79  Mom to postpartum.  Baby to Couplet care / Skin to Skin.  Jamie ManisSherin Abraham, DO PGY-2 06/10/2018, 12:13 PM   Head delivered LOA with hand presentation with loose nuchal. Shoulder and body delivered via summersault technique. Infant with spontaneous cry, placed on mother's abdomen, dried and bulb suctioned. Cord clamped x 2 after 1-minute delay, and cut by family member. Cord blood drawn. Placenta delivered spontaneously with gentle cord traction. Fundus firm with massage and Pitocin. Perineum inspected and found to have 1st degree laceration which was repaired with 2.0 vicryl rapide with good hemostasis achieved.   Midwife attestation: I was gloved and present for delivery in its entirety and I agree with the above resident's note.  Jamie Barker, CNM 12:38 PM

## 2018-05-10 DIAGNOSIS — Z6791 Unspecified blood type, Rh negative: Secondary | ICD-10-CM | POA: Diagnosis present

## 2018-05-17 LAB — OB RESULTS CONSOLE GC/CHLAMYDIA
Chlamydia: NEGATIVE
Gonorrhea: NEGATIVE

## 2018-05-17 LAB — OB RESULTS CONSOLE HIV ANTIBODY (ROUTINE TESTING): HIV: NONREACTIVE

## 2018-05-17 LAB — OB RESULTS CONSOLE RPR: RPR: NONREACTIVE

## 2018-05-17 LAB — OB RESULTS CONSOLE GBS: STREP GROUP B AG: NEGATIVE

## 2018-05-17 LAB — OB RESULTS CONSOLE RUBELLA ANTIBODY, IGM: RUBELLA: IMMUNE

## 2018-05-17 LAB — OB RESULTS CONSOLE HEPATITIS B SURFACE ANTIGEN: Hepatitis B Surface Ag: NEGATIVE

## 2018-05-18 DIAGNOSIS — D649 Anemia, unspecified: Secondary | ICD-10-CM | POA: Diagnosis present

## 2018-06-09 ENCOUNTER — Encounter (HOSPITAL_COMMUNITY): Payer: Self-pay

## 2018-06-09 ENCOUNTER — Inpatient Hospital Stay (HOSPITAL_COMMUNITY)
Admission: AD | Admit: 2018-06-09 | Discharge: 2018-06-12 | DRG: 807 | Disposition: A | Discharge status: Home / Self Care | Payer: Medicaid Other | Attending: Obstetrics and Gynecology | Admitting: Obstetrics and Gynecology

## 2018-06-09 DIAGNOSIS — O4292 Full-term premature rupture of membranes, unspecified as to length of time between rupture and onset of labor: Principal | ICD-10-CM | POA: Diagnosis present

## 2018-06-09 DIAGNOSIS — O4290 Premature rupture of membranes, unspecified as to length of time between rupture and onset of labor, unspecified weeks of gestation: Secondary | ICD-10-CM | POA: Diagnosis present

## 2018-06-09 DIAGNOSIS — O9902 Anemia complicating childbirth: Secondary | ICD-10-CM | POA: Diagnosis present

## 2018-06-09 DIAGNOSIS — Z6791 Unspecified blood type, Rh negative: Secondary | ICD-10-CM | POA: Diagnosis present

## 2018-06-09 DIAGNOSIS — O26893 Other specified pregnancy related conditions, third trimester: Secondary | ICD-10-CM | POA: Diagnosis present

## 2018-06-09 DIAGNOSIS — Z3A39 39 weeks gestation of pregnancy: Secondary | ICD-10-CM

## 2018-06-09 DIAGNOSIS — Z331 Pregnant state, incidental: Secondary | ICD-10-CM

## 2018-06-09 DIAGNOSIS — D649 Anemia, unspecified: Secondary | ICD-10-CM | POA: Diagnosis present

## 2018-06-09 DIAGNOSIS — O322XX0 Maternal care for transverse and oblique lie, not applicable or unspecified: Secondary | ICD-10-CM | POA: Diagnosis present

## 2018-06-09 DIAGNOSIS — O0930 Supervision of pregnancy with insufficient antenatal care, unspecified trimester: Secondary | ICD-10-CM

## 2018-06-09 NOTE — MAU Note (Signed)
Pt here with c/o ROM about 1000 initially, then more gushing at 1700 tonight. Reports fetal movement. Denies any bleeding or leaking or contractions. GBS neg.

## 2018-06-10 ENCOUNTER — Inpatient Hospital Stay (HOSPITAL_COMMUNITY): Payer: Medicaid Other | Admitting: Anesthesiology

## 2018-06-10 ENCOUNTER — Other Ambulatory Visit: Payer: Self-pay

## 2018-06-10 ENCOUNTER — Encounter (HOSPITAL_COMMUNITY): Payer: Self-pay

## 2018-06-10 DIAGNOSIS — O322XX0 Maternal care for transverse and oblique lie, not applicable or unspecified: Secondary | ICD-10-CM | POA: Diagnosis present

## 2018-06-10 DIAGNOSIS — Z6791 Unspecified blood type, Rh negative: Secondary | ICD-10-CM | POA: Diagnosis not present

## 2018-06-10 DIAGNOSIS — O26893 Other specified pregnancy related conditions, third trimester: Secondary | ICD-10-CM | POA: Diagnosis present

## 2018-06-10 DIAGNOSIS — O9902 Anemia complicating childbirth: Secondary | ICD-10-CM | POA: Diagnosis present

## 2018-06-10 DIAGNOSIS — O4292 Full-term premature rupture of membranes, unspecified as to length of time between rupture and onset of labor: Secondary | ICD-10-CM | POA: Diagnosis present

## 2018-06-10 DIAGNOSIS — Z3A39 39 weeks gestation of pregnancy: Secondary | ICD-10-CM

## 2018-06-10 DIAGNOSIS — O4202 Full-term premature rupture of membranes, onset of labor within 24 hours of rupture: Secondary | ICD-10-CM

## 2018-06-10 DIAGNOSIS — O4290 Premature rupture of membranes, unspecified as to length of time between rupture and onset of labor, unspecified weeks of gestation: Secondary | ICD-10-CM | POA: Diagnosis present

## 2018-06-10 DIAGNOSIS — D649 Anemia, unspecified: Secondary | ICD-10-CM | POA: Diagnosis present

## 2018-06-10 LAB — CBC
HEMATOCRIT: 31.8 % — AB (ref 36.0–46.0)
HEMOGLOBIN: 10.1 g/dL — AB (ref 12.0–15.0)
MCH: 26.2 pg (ref 26.0–34.0)
MCHC: 31.8 g/dL (ref 30.0–36.0)
MCV: 82.6 fL (ref 80.0–100.0)
PLATELETS: 348 10*3/uL (ref 150–400)
RBC: 3.85 MIL/uL — AB (ref 3.87–5.11)
RDW: 16 % — ABNORMAL HIGH (ref 11.5–15.5)
WBC: 11.4 10*3/uL — ABNORMAL HIGH (ref 4.0–10.5)

## 2018-06-10 LAB — POCT FERN TEST: POCT Fern Test: POSITIVE

## 2018-06-10 LAB — RPR: RPR Ser Ql: NONREACTIVE

## 2018-06-10 MED ORDER — PHENYLEPHRINE 40 MCG/ML (10ML) SYRINGE FOR IV PUSH (FOR BLOOD PRESSURE SUPPORT)
80.0000 ug | PREFILLED_SYRINGE | INTRAVENOUS | Status: DC | PRN
  Filled 2018-06-10: qty 5
  Filled 2018-06-10: qty 10

## 2018-06-10 MED ORDER — TETANUS-DIPHTH-ACELL PERTUSSIS 5-2.5-18.5 LF-MCG/0.5 IM SUSP: 0.5000 mL | Freq: Once | INTRAMUSCULAR | Status: DC

## 2018-06-10 MED ORDER — OXYCODONE-ACETAMINOPHEN 5-325 MG PO TABS: 1.0000 | ORAL_TABLET | ORAL | Status: DC | PRN

## 2018-06-10 MED ORDER — OXYTOCIN BOLUS FROM INFUSION
500.0000 mL | Freq: Once | INTRAVENOUS | Status: AC
  Administered 2018-06-10: 500 mL via INTRAVENOUS

## 2018-06-10 MED ORDER — LACTATED RINGERS IV SOLN
INTRAVENOUS | Status: DC
  Administered 2018-06-10 (×4): via INTRAVENOUS

## 2018-06-10 MED ORDER — EPHEDRINE 5 MG/ML INJ
10.0000 mg | INTRAVENOUS | Status: DC | PRN
  Filled 2018-06-10: qty 2

## 2018-06-10 MED ORDER — ACETAMINOPHEN 325 MG PO TABS: 650.0000 mg | ORAL_TABLET | ORAL | Status: DC | PRN

## 2018-06-10 MED ORDER — OXYCODONE-ACETAMINOPHEN 5-325 MG PO TABS: 2.0000 | ORAL_TABLET | ORAL | Status: DC | PRN

## 2018-06-10 MED ORDER — ONDANSETRON HCL 4 MG PO TABS: 4.0000 mg | ORAL_TABLET | ORAL | Status: DC | PRN

## 2018-06-10 MED ORDER — LACTATED RINGERS IV SOLN: 500.0000 mL | Freq: Once | INTRAVENOUS | Status: DC

## 2018-06-10 MED ORDER — SIMETHICONE 80 MG PO CHEW: 80.0000 mg | CHEWABLE_TABLET | ORAL | Status: DC | PRN

## 2018-06-10 MED ORDER — COCONUT OIL OIL
1.0000 "application " | TOPICAL_OIL | Status: DC | PRN
  Administered 2018-06-10: 1 via TOPICAL
  Filled 2018-06-10: qty 120

## 2018-06-10 MED ORDER — LACTATED RINGERS IV SOLN: 500.0000 mL | INTRAVENOUS | Status: DC | PRN

## 2018-06-10 MED ORDER — ONDANSETRON HCL 4 MG/2ML IJ SOLN: 4.0000 mg | Freq: Four times a day (QID) | INTRAMUSCULAR | Status: DC | PRN

## 2018-06-10 MED ORDER — PRENATAL MULTIVITAMIN CH
1.0000 | ORAL_TABLET | Freq: Every day | ORAL | Status: DC
  Administered 2018-06-11 – 2018-06-12 (×2): 1 via ORAL
  Filled 2018-06-10 (×2): qty 1

## 2018-06-10 MED ORDER — TERBUTALINE SULFATE 1 MG/ML IJ SOLN
0.2500 mg | Freq: Once | INTRAMUSCULAR | Status: DC | PRN
  Filled 2018-06-10: qty 1

## 2018-06-10 MED ORDER — DIPHENHYDRAMINE HCL 25 MG PO CAPS: 25.0000 mg | ORAL_CAPSULE | Freq: Four times a day (QID) | ORAL | Status: DC | PRN

## 2018-06-10 MED ORDER — LIDOCAINE HCL (PF) 1 % IJ SOLN
30.0000 mL | INTRAMUSCULAR | Status: DC | PRN
  Filled 2018-06-10: qty 30

## 2018-06-10 MED ORDER — LIDOCAINE HCL (PF) 1 % IJ SOLN
INTRAMUSCULAR | Status: DC | PRN
  Administered 2018-06-10: 8 mL via EPIDURAL

## 2018-06-10 MED ORDER — DIPHENHYDRAMINE HCL 50 MG/ML IJ SOLN: 12.5000 mg | INTRAMUSCULAR | Status: DC | PRN

## 2018-06-10 MED ORDER — FENTANYL 2.5 MCG/ML BUPIVACAINE 1/10 % EPIDURAL INFUSION (WH - ANES)
14.0000 mL/h | INTRAMUSCULAR | Status: DC | PRN
  Administered 2018-06-10: 14 mL/h via EPIDURAL
  Filled 2018-06-10: qty 100

## 2018-06-10 MED ORDER — DIBUCAINE 1 % RE OINT: 1.0000 "application " | TOPICAL_OINTMENT | RECTAL | Status: DC | PRN

## 2018-06-10 MED ORDER — OXYTOCIN 40 UNITS IN LACTATED RINGERS INFUSION - SIMPLE MED
1.0000 m[IU]/min | INTRAVENOUS | Status: DC
  Administered 2018-06-10: 2 m[IU]/min via INTRAVENOUS
  Filled 2018-06-10: qty 1000

## 2018-06-10 MED ORDER — PHENYLEPHRINE 40 MCG/ML (10ML) SYRINGE FOR IV PUSH (FOR BLOOD PRESSURE SUPPORT)
80.0000 ug | PREFILLED_SYRINGE | INTRAVENOUS | Status: DC | PRN
  Filled 2018-06-10: qty 5

## 2018-06-10 MED ORDER — FENTANYL CITRATE (PF) 100 MCG/2ML IJ SOLN: 100.0000 ug | INTRAMUSCULAR | Status: DC | PRN

## 2018-06-10 MED ORDER — OXYTOCIN 40 UNITS IN LACTATED RINGERS INFUSION - SIMPLE MED: 2.5000 [IU]/h | INTRAVENOUS | Status: DC

## 2018-06-10 MED ORDER — BENZOCAINE-MENTHOL 20-0.5 % EX AERO: 1.0000 "application " | INHALATION_SPRAY | CUTANEOUS | Status: DC | PRN

## 2018-06-10 MED ORDER — SOD CITRATE-CITRIC ACID 500-334 MG/5ML PO SOLN: 30.0000 mL | ORAL | Status: DC | PRN

## 2018-06-10 MED ORDER — IBUPROFEN 600 MG PO TABS
600.0000 mg | ORAL_TABLET | Freq: Four times a day (QID) | ORAL | Status: DC
  Administered 2018-06-10 – 2018-06-12 (×8): 600 mg via ORAL
  Filled 2018-06-10 (×8): qty 1

## 2018-06-10 MED ORDER — ONDANSETRON HCL 4 MG/2ML IJ SOLN: 4.0000 mg | INTRAMUSCULAR | Status: DC | PRN

## 2018-06-10 MED ORDER — SENNOSIDES-DOCUSATE SODIUM 8.6-50 MG PO TABS
2.0000 | ORAL_TABLET | ORAL | Status: DC
  Administered 2018-06-10 – 2018-06-11 (×2): 2 via ORAL
  Filled 2018-06-10 (×2): qty 2

## 2018-06-10 MED ORDER — WITCH HAZEL-GLYCERIN EX PADS: 1.0000 "application " | MEDICATED_PAD | CUTANEOUS | Status: DC | PRN

## 2018-06-10 NOTE — H&P (Addendum)
OBSTETRIC ADMISSION HISTORY AND PHYSICAL  Jamie Barker is a 27 y.o. female [phone] with IUP at [redacted]w[redacted]d by LMP (consistent with 3rd trimester [place]) presenting for PROM.   Reports fetal movement. Denies vaginal bleeding and contractions. States she had a small gush of fluid around 1000 on 11/13 and then a larger gush of fluid around 1730 in the evening.   She received her prenatal [facility] at [facility] (within the [facility]).  Support person in labor: husband  Ultrasounds . Anatomy U/S: normal on [place] completed at [redacted]w[redacted]d   Prenatal History/Complications: . Late to prenatal care (established care at [redacted]w[redacted]d) . Anemia - has not been taking a prenatal vitamin  Past Medical History: Past Medical History:  Diagnosis Date  . Anemia   . FH: hypertension   . History of positive PPD, untreated   . Late prenatal care   . Rh negative status during pregnancy   . UTI (urinary tract infection)     Past Surgical History: Past Surgical History:  Procedure Laterality Date  . WISDOM TOOTH EXTRACTION      Obstetrical History: OB History    Gravida  4   Para  3   Term  3   Preterm  0   AB  0   Living  3     SAB  0   TAB  0   Ectopic  0   Multiple  0   Live Births  3           Social History: Social History   Socioeconomic History  . Marital status: Married    Spouse name: Not on file  . Number of children: Not on file  . Years of education: Not on file  . Highest education level: Not on file  Occupational History  . Not on file  Social Needs  . Financial resource strain: Not on file  . Food insecurity:    Worry: Not on file    Inability: Not on file  . Transportation needs:    Medical: Not on file    Non-medical: Not on file  Tobacco Use  . Smoking status: Never Smoker  . Smokeless tobacco: Never Used  Substance and Sexual Activity  . Alcohol use: No  . Drug use: No  . Sexual activity: Yes    Birth control/protection: IUD    Comment: para guard IUD  Lifestyle   . Physical activity:    Days per week: Not on file    Minutes per session: Not on file  . Stress: Not on file  Relationships  . Social connections:    Talks on phone: Not on file    Gets together: Not on file    Attends religious service: Not on file    Active member of club or organization: Not on file    Attends meetings of clubs or organizations: Not on file    Relationship status: Not on file  Other Topics Concern  . Not on file  Social History Narrative  . Not on file    Family History: Family History  Problem Relation Age of Onset  . Anemia Mother   . Hypertension Father   . Consanguinity Maternal Aunt   . Consanguinity Maternal Uncle   . Consanguinity Paternal Aunt   . Consanguinity Paternal Uncle   . Consanguinity Cousin     Allergies: No Known Allergies  Medications Prior to Admission  Medication Sig Dispense Refill Last Dose  . benzocaine-Menthol (DERMOPLAST) 20-0.5 % AERO Apply 1  application topically as needed for irritation (perineal discomfort). 1 each 1 Unknown at Unknown time  . ibuprofen (ADVIL,MOTRIN) 600 MG tablet Take 1 tablet (600 mg total) by mouth every 6 (six) hours. 30 tablet 0 Unknown at Unknown time  . norethindrone (ORTHO MICRONOR) 0.35 MG tablet Take 1 tablet (0.35 mg total) by mouth daily. 1 Package 11   . Prenatal Vit-Fe Fumarate-FA (PRENATAL MULTIVITAMIN) TABS Take 1 tablet by mouth daily.   Unknown at Unknown time     Review of Systems  All systems reviewed and negative except as stated in HPI  Blood pressure 128/71, pulse 69, temperature 98.1 F (36.7 C), temperature source Oral, resp. rate 18, height 5\' 4"  (1.626 m), weight 100.2 kg, SpO2 100 %, unknown if currently breastfeeding. General appearance: alert, cooperative and no distress Lungs: no respiratory distress Heart: regular rate  Abdomen: soft, non-tender; gravid b Presentation: cephalic Fetal monitoring: Cat 1 Uterine activity: quiet Dilation: 3 Effacement (%):  50 Station: -2 Exam by:: Roxan Hockey RN   Prenatal labs: ABO, Rh: --/--/O NEG (11/14 0038) Antibody: POS (11/14 0038) Rubella:  immune RPR:   nonreactive HBsAg:   negative HIV:   negative GBS:   negative Glucola: not completed Genetic screening:  None  Prenatal Transfer Tool  Maternal Diabetes: No Genetic Screening: Declined Maternal Ultrasounds/Referrals: Normal Fetal Ultrasounds or other Referrals:  None Maternal Substance Abuse:  No Significant Maternal Medications:  None Significant Maternal Lab Results: None  Results for orders placed or performed during the hospital encounter of 06/09/18 (from the past 24 hour(s))  POCT fern test   Collection Time: 06/10/18 12:22 AM  Result Value Ref Range   POCT Fern Test Positive = ruptured amniotic membanes   CBC   Collection Time: 06/10/18 12:38 AM  Result Value Ref Range   WBC 11.4 (H) 4.0 - 10.5 K/uL   RBC 3.85 (L) 3.87 - 5.11 MIL/uL   Hemoglobin 10.1 (L) 12.0 - 15.0 g/dL   HCT 95.6 (L) 21.3 - 08.6 %   MCV 82.6 80.0 - 100.0 fL   MCH 26.2 26.0 - 34.0 pg   MCHC 31.8 30.0 - 36.0 g/dL   RDW 57.8 (H) 46.9 - 62.9 %   Platelets 348 150 - 400 K/uL  Type and screen [facility]   Collection Time: 06/10/18 12:38 AM  Result Value Ref Range   ABO/RH(D) O NEG    Antibody Screen POS    Sample Expiration      06/13/2018 Performed at [facility], [address] [place], [place] [address]    Antibody Identification PENDING     Patient Active Problem List   Diagnosis Date Noted  . Amniotic fluid leaking 06/10/2018  . Indication for care in labor or delivery 12/28/2016  . Active labor 11/20/2013  . Pregnancy, post-term 10/20/2011  . Pregnancy as incidental finding 10/18/2011    Assessment/Plan:  Jamie Barker is a 27 y.o. G4P3003 at [redacted]w[redacted]d here for PROM  Labor: allowing patient to labor naturally for now -- consider pitocin augmentation if patient is not making adequate change -- pain control:  does not want an epidural -- will closely monitor maternal and fetal HRs as well as maternal temperature for signs of chorioamnionitis   Fetal Wellbeing: Cephalic by prior exam -- GBS (-) -- continuous fetal monitoring - Cat 1  Postpartum Planning -- breast/paraguard -- R negative, will evaluate for Rhogam  Late to establish care -- SW consult after delivery   Burman Nieves, MD Family  Medicine Resident   CNM attestation:  I have seen and examined this patient; I agree with above documentation in the resident's note.   Jamie Barker is a 27 y.o. [id number] here for PROM  PE: BP 125/65   Pulse 72   Temp 97.8 F (36.6 C) (Oral)   Resp 16   Ht 5\' 4"  (1.626 m)   Wt 100.2 kg   LMP  (Exact Date)   SpO2 99%   BMI 37.93 kg/m  Gen: calm comfortable, NAD Resp: normal effort, no distress Abd: gravid  ROS, labs, PMH reviewed  [facility]: [facility] Expectant management to start; Pit prn Anticipate SVD Eval for Rhogam pp  Cam HaiSHAW, Cecil Vandyke CNM 06/10/2018, 9:06 AM

## 2018-06-10 NOTE — Addendum Note (Signed)
Addendum  created 06/10/18 1709 by Algis GreenhouseBurger, Dalicia Kisner A, CRNA   Charge Capture section accepted, Sign clinical note

## 2018-06-10 NOTE — Anesthesia Pain Management Evaluation Note (Signed)
  CRNA Pain Management Visit Note  Patient: Jamie Barker, 27 y.o., female  "Hello I am a member of the [facility]. We have an anesthesia team available at all times to provide care throughout the hospital, including epidural management and anesthesia for C-section. I don't know your plan for the delivery whether it a natural birth, water birth, IV sedation, nitrous supplementation, doula or epidural, but we want to meet your pain goals."   1.Was your pain managed to your expectations on prior hospitalizations?   Yes   2.What is your expectation for pain management during this hospitalization?     Epidural  3.How can we help you reach that goal? epidural  Record the patient's initial score and the patient's pain goal.   Pain: 4  Pain Goal: 7 [facility] wants you to be able to say your pain was always managed very well.  Cleda ClarksBrowder, Destan Franchini R 06/10/2018

## 2018-06-10 NOTE — Anesthesia Preprocedure Evaluation (Signed)
Anesthesia Evaluation  Patient identified by MRN, date of birth, ID band Patient awake    Reviewed: Allergy & Precautions, H&P , NPO status , Patient's Chart, lab work & pertinent test results, reviewed documented beta blocker date and time   Airway Mallampati: II  TM Distance: >3 FB Neck ROM: full    Dental no notable dental hx.    Pulmonary neg pulmonary ROS,    Pulmonary exam normal breath sounds clear to auscultation       Cardiovascular negative cardio ROS Normal cardiovascular exam Rhythm:regular Rate:Normal     Neuro/Psych negative neurological ROS  negative psych ROS   GI/Hepatic negative GI ROS, Neg liver ROS,   Endo/Other  negative endocrine ROS  Renal/GU negative Renal ROS  negative genitourinary   Musculoskeletal   Abdominal   Peds  Hematology  (+) Blood dyscrasia, anemia ,   Anesthesia Other Findings   Reproductive/Obstetrics (+) Pregnancy                             Anesthesia Physical Anesthesia Plan  ASA: II  Anesthesia Plan: Epidural   Post-op Pain Management:    Induction:   PONV Risk Score and Plan:   Airway Management Planned:   Additional Equipment:   Intra-op Plan:   Post-operative Plan:   Informed Consent: I have reviewed the patients History and Physical, chart, labs and discussed the procedure including the risks, benefits and alternatives for the proposed anesthesia with the patient or authorized representative who has indicated his/her understanding and acceptance.   Dental Advisory Given  Plan Discussed with: Anesthesiologist, Surgeon and CRNA  Anesthesia Plan Comments: (Labs checked- platelets confirmed with RN in room. Fetal heart tracing, per RN, reported to be stable enough for sitting procedure. Discussed epidural, and patient consents to the procedure:  included risk of possible headache,backache, failed block, allergic reaction, and  nerve injury. This patient was asked if she had any questions or concerns before the procedure started.)        Anesthesia Quick Evaluation

## 2018-06-10 NOTE — Progress Notes (Signed)
OB/GYN Faculty Practice: Labor Progress Note  Subjective: Resting comfortably, feeling her contractions but they aren't axetremyl painful.   Objective: BP 116/62   Pulse 86   Temp 98.1 F (36.7 C) (Oral)   Resp 16   Ht 5\' 4"  (1.626 m)   Wt 100.2 kg   LMP  (Exact Date)   SpO2 100%   BMI 37.93 kg/m  Gen: mild discomfort during contractions Dilation: 4.5 Effacement (%): 30 Cervical Position: Middle Station: -2 Presentation: Vertex Exam by:: Dr. Darin EngelsAbraham  Assessment and Plan: 27 y.o. [phone] 7415w6d here for PROM  Labor: patient making cervical change, will hold off on pitocin until next check  -- pain control: does not want an epidural -- will closely monitor maternal and fetal HRs as well as maternal temperature for signs of chorioamnionitis   Fetal Wellbeing: Cephalic by prior exam -- GBS (-) -- continuous fetal monitoring - Cat 1  Burman NievesJulia Wetzel Meester, MD Family Medicine Resident 4:55 AM

## 2018-06-10 NOTE — Anesthesia Procedure Notes (Signed)
Epidural Patient location during procedure: OB Start time: 06/10/2018 8:15 AM End time: 06/10/2018 8:20 AM  Staffing Anesthesiologist: Bethena Midgetddono, Brynna Dobos, MD  Preanesthetic Checklist Completed: patient identified, site marked, surgical consent, pre-op evaluation, timeout performed, IV checked, risks and benefits discussed and monitors and equipment checked  Epidural Patient position: sitting Prep: site prepped and draped and DuraPrep Patient monitoring: continuous pulse ox and blood pressure Approach: midline Location: L4-L5 Injection technique: LOR air  Needle:  Needle type: Tuohy  Needle gauge: 17 G Needle length: 9 cm and 9 Needle insertion depth: 7 cm Catheter type: closed end flexible Catheter size: 19 Gauge Catheter at skin depth: 13 cm Test dose: negative  Assessment Events: blood not aspirated, injection not painful, no injection resistance, negative IV test and no paresthesia

## 2018-06-10 NOTE — Progress Notes (Signed)
LABOR PROGRESS NOTE  Jamie Barker is a 27 y.o. [id number] at 2835w6d  admitted for PROM  Subjective: Doing well. No concerns at this time.   Objective: BP 130/71   Pulse 67   Temp 97.7 F (36.5 C) (Oral)   Resp 16   Ht 5\' 4"  (1.626 m)   Wt 100.2 kg   LMP  (Exact Date)   SpO2 99%   BMI 37.93 kg/m  or  Vitals:   06/10/18 0850 06/10/18 0851 06/10/18 0901 06/10/18 0926  BP:  125/65 130/71   Pulse:  72 67   Resp:      Temp:    97.7 F (36.5 C)  TempSrc:    Oral  SpO2: 99%     Weight:      Height:        Dilation: 5.5 Effacement (%): 60 Cervical Position: Middle Station: -2 Presentation: Vertex Exam by:: J.Cox, RN FHT: baseline rate 130, mild varibility, +acel, +early decel Toco: q4-645min  Labs: Lab Results  Component Value Date   WBC 11.4 (H) 06/10/2018   HGB 10.1 (L) 06/10/2018   HCT 31.8 (L) 06/10/2018   MCV 82.6 06/10/2018   PLT 348 06/10/2018    Patient Active Problem List   Diagnosis Date Noted  . Amniotic fluid leaking 06/10/2018  . Indication for care in labor or delivery 12/28/2016  . Active labor 11/20/2013  . Pregnancy, post-term 10/20/2011  . Pregnancy as incidental finding 10/18/2011    Assessment / Plan: 27 y.o. G4P3003 at 6235w6d here for IOL for PROM   Labor: Continue Pit (694miliunits/min) Fetal Wellbeing:  Cat 1 Pain Control:  Per patient request Anticipated MOD:  NSVD  Oralia ManisSherin Lakin Romer, DO PGY-2 06/10/2018, 9:34 AM

## 2018-06-10 NOTE — Lactation Note (Signed)
This note was copied from a baby's chart. Lactation Consultation Note  Patient Name: Jamie Barker ZOXWR'UToday's Date: 06/10/2018 Reason for consult: Initial assessment;Term P4, 11 hour female infant. Mom's feeding plan at admission is Breastfeeding and supplementing with formula. BF concerns: Mom has  semi short shafted nipples, hx of anemia, hypertension and Hep C. Per mom , doesn't have breast pump at home manual (harmony) hand pump given and explained how to use. [facility] [place]. LC discussed   coconut oil  use to help with preventing dryness and  discussed mom avoid cracks in breast or bleeding due hx of Hep C with breastfeeding. LC demonstrated how to use hand pump to pre-pump breast before latching infant to help elongate nipple shaft out more. Mom BF on right side using cross cradle hold, infant sustained latch, audible swallowing observed by LC infant BF for 10 minutes. LC discussed how to assemble, reassemble and clean hand pump.  Mom will wear breast shells to help evert nipple shaft out more, mom taught back how to use them and mom knows not to sleep in breast shells. LC discussed I & O. Reviewed Baby & Me book's Breastfeeding Basics.  Mom made aware of O/P services, breastfeeding support groups, community resources, and our phone # for post-discharge questions.  Mom's current feeding plan: 1. Mom BF  According hunger cues, 8 to 12 times within 24 hours including nights. 2. Mom will wear breast shells in bra but will not sleep in them to help evert nipple shaft out more. 3. Mom will pre-pump, do nipple roll or hand express prior to latching infant to breast due short shafted nipples. 4. Mom plans BF first and then supplement with formula since both are her feeding choices.   Maternal Data Formula Feeding for Exclusion: No Has patient been taught Hand Expression?: Yes Does the patient have breastfeeding experience prior to  this delivery?: Yes  Feeding Feeding Type: Breast Fed Nipple Type: Slow - flow  LATCH Score                   Interventions    Lactation Tools Discussed/Used Tools: Shells WIC Program: No Pump Review: Setup, frequency, and cleaning;Milk Storage Initiated by:: Jamie Earthlyobin Issiah Barker, IBCLC Date initiated:: 06/11/18   Consult Status      Jamie Barker 06/10/2018, 11:18 PM

## 2018-06-10 NOTE — Anesthesia Postprocedure Evaluation (Signed)
Anesthesia Post Note  Patient: Jamie Barker  Procedure(s) Performed: AN AD HOC LABOR EPIDURAL     Patient location during evaluation: Mother Baby Anesthesia Type: Epidural Level of consciousness: awake and alert Pain management: pain level controlled Vital Signs Assessment: post-procedure vital signs reviewed and stable Respiratory status: spontaneous breathing, nonlabored ventilation and respiratory function stable Cardiovascular status: stable Postop Assessment: no headache, no backache and epidural receding Anesthetic complications: no    Last Vitals:  Vitals:   06/10/18 1345 06/10/18 1451  BP: 130/80 129/72  Pulse: 78 80  Resp: 16 16  Temp: 37 C   SpO2: 100% 100%    Last Pain:  Vitals:   06/10/18 1452  TempSrc:   PainSc: 0-No pain   Pain Goal: Patients Stated Pain Goal: 6 (06/10/18 0107)               Suella Cogar

## 2018-06-10 NOTE — Progress Notes (Signed)
Patient ID: Jamie Barker, female   DOB: [birth year], 27 y.o.   MRN: [id number]  Pt feels ctx have spaced out  BP 120/64, P 79 FHR 125, +accels, no decels Ctx irreg, mild Cx was 4-5/thick/vtx -2 @ 0330  IUP@term  SROM approx 15h GBS neg  Will start Pit to augment labor Pt desires epidural first Anticipate SVD  Cam HaiSHAW, KIMBERLY CNM 06/10/2018 9:05 AM

## 2018-06-10 NOTE — Anesthesia Postprocedure Evaluation (Signed)
Anesthesia Post Note  Patient: Jamie Barker  Procedure(s) Performed: AN AD HOC LABOR EPIDURAL     Patient location during evaluation: Mother Baby Anesthesia Type: Epidural Level of consciousness: awake Pain management: satisfactory to patient Vital Signs Assessment: post-procedure vital signs reviewed and stable Respiratory status: spontaneous breathing Cardiovascular status: stable Anesthetic complications: no    Last Vitals:  Vitals:   06/10/18 1345 06/10/18 1451  BP: 130/80 129/72  Pulse: 78 80  Resp: 16 16  Temp: 37 C   SpO2: 100% 100%    Last Pain:  Vitals:   06/10/18 1452  TempSrc:   PainSc: 0-No pain   Pain Goal: Patients Stated Pain Goal: 6 (06/10/18 0107)               Cephus ShellingBURGER,[name]

## 2018-06-11 MED ORDER — RHO D IMMUNE GLOBULIN 1500 UNIT/2ML IJ SOSY
300.0000 ug | PREFILLED_SYRINGE | Freq: Once | INTRAMUSCULAR | Status: AC
  Administered 2018-06-11: 300 ug via INTRAVENOUS
  Filled 2018-06-11: qty 2

## 2018-06-11 NOTE — Progress Notes (Addendum)
POSTPARTUM PROGRESS NOTE  Post Partum Day 1 Subjective:  Jamie Barker is a 27 y.o. [id number] 6646w6d s/p NSVD.  No acute events overnight.  Pt denies problems with ambulating, voiding or po intake.  She denies nausea or vomiting.  Pain is well controlled.  She has had flatus. She has not had bowel movement.  Lochia Moderate.   Objective: Blood pressure (!) 114/59, pulse 75, temperature 97.7 F (36.5 C), temperature source Oral, resp. rate 18, height 5\' 4"  (1.626 m), weight 100.2 kg, SpO2 100 %, unknown if currently breastfeeding.  Physical Exam:  General: alert, cooperative and no distress Lochia:normal flow Chest: no respiratory distress Heart:regular rate, distal pulses intact Incision: n/a Abdomen: soft, nontender,  Uterine Fundus: firm, appropriately tender DVT Evaluation: No calf swelling or tenderness Extremities: trace edema  Recent Labs    06/10/18 0038  HGB 10.1*  HCT 31.8*    Assessment/Plan:  ASSESSMENT: Jamie Barker is a 27 y.o. [id number] 9546w6d s/p NSVD. Pregnancy c/b late prenatal care, Rh negative. Social work consult pending. Rhogam eval pending.   Plan for discharge tomorrow   LOS: 1 day   Jamie Barker LMD 06/11/2018, 11:56 AM

## 2018-06-11 NOTE — Clinical Social Work Maternal (Signed)
CLINICAL SOCIAL WORK MATERNAL/CHILD NOTE  Patient Details  Name: Jamie Barker MRN: [id number] Date of Birth: May 01, 1991  Date:  06/11/2018  Clinical Social Worker Initiating Note:  Laurey Arrow Date/Time: Initiated:  06/11/18/1053     Child's Name:  undecided   Biological Parents:  Mother, Father   Need for Interpreter:  None   Reason for Referral:  Late or No Prenatal Care    Address:  [address] [place] [address]    Phone number:  [phone] (home)     Additional phone number:   Household Members/Support Persons (HM/SP):   Household Member/Support Person 1, Household Member/Support Person 2, Household Member/Support Person 3, Household Member/Support Person 4   HM/SP Name Relationship DOB or Age  HM/SP -53 Cindee Lame FOB 12/29/83  HM/SP -2 Mitchel Honour son 10/19/11  HM/SP -3 Orie Fisherman son 11/20/13  HM/SP -4 Jonne Ply son 11/20/16  HM/SP -5        HM/SP -6        HM/SP -7        HM/SP -8          Natural Supports (not living in the home):  Extended Family, Parent, Immediate Family   Professional Supports: None   Employment: Full-time   Type of 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[place], [place])   Education:  [place] arranged:    Museum/gallery curator Resources:  Medicaid   Other Resources:      Cultural/Religious Considerations Which May Impact Care:  none reported  Strengths:  Ability to meet basic needs , Engineer, materials, Home prepared for child    Psychotropic Medications:         Pediatrician:    [place]olic[place] area  Pediatrician List:   [place]      Pediatrician Fax Number:    Risk Factors/Current Problems:  None   Cognitive State:  Able to Concentrate , Alert , Linear Thinking , Insightful , Goal Oriented    Mood/Affect:  Happy , Bright , Interested , Relaxed , Comfortable     CSW Assessment:  CSW met with MOB at bedside in room 118 to complete assessment for consult regarding late/limited PNC. Upon this writers arrival, MOB was resting in bed and infant was asleep in bassinet. MOB was warm and welcoming. This Probation officer inquired about MOB's [place] and MOB stated, "I really don't have a real reason for why I didn't go to the Dr.sooner; I knew I was pregnant.   CSW assessed for barriers with MOB following-up with infant well child visits and MOB denied barriers.  MOB communicated having a strong support team and having all essential items needed to parent infant.    This Probation officer educated MOB on the hospitals policy and procedure regarding LPN and mandated reporting for positive screens. MOB verbalized understanding. This Probation officer informed MOB that if UDS or CDS come back positive she will be notified and a report will be made to [facility]. MOB verbalized understanding and noted no further questions as she is confident that infant's screens will be negative. MOB denied the use of all illicit substances.  This Probation officer assessed for other psychosocial stressors. MOB notes she has no further needs and has everything she needs for baby's arrival home to include car seat, crib and clothing items. At this time, CSW  has no barriers to d/c at this time  CSW Plan/Description:  No Further Intervention Required/No Barriers to Discharge, Sudden Infant Death Syndrome (SIDS) Education, Perinatal Mood and Anxiety Disorder (PMADs) Education, [place], CSW Will Continue to Monitor Umbilical Cord Tissue Drug Screen Results and Make Report if Warranted   Laurey Arrow, MSW, LCSW Clinical Social Work [phone]   Dimple Nanas, LCSW 06/11/2018, 12:00 PM

## 2018-06-12 DIAGNOSIS — E669 Obesity, unspecified: Secondary | ICD-10-CM | POA: Insufficient documentation

## 2018-06-12 DIAGNOSIS — O0930 Supervision of pregnancy with insufficient antenatal care, unspecified trimester: Secondary | ICD-10-CM

## 2018-06-12 LAB — RH IG WORKUP (INCLUDES ABO/RH)
ABO/RH(D): O NEG
Fetal Screen: NEGATIVE
GESTATIONAL AGE(WKS): 39.6
Unit division: 0

## 2018-06-12 NOTE — Lactation Note (Signed)
This note was copied from a baby's chart. Lactation Consultation Note  Patient Name: Jamie Barker Reason for consult: Follow-up assessment;Term;Infant weight loss;Other (Comment)(exp BF / pe rmom I plan to work on breast feeding when she gets home )  Pecola LeisureBaby is 5946 hours old and had been bottle fed all night.  LC reviewed supply and demand, and the  Importance of establishing milk supply . Sore nipple and engorgement prevention tx.  Mother informed of post-discharge support and given phone number to the lactation department, including services for phone call assistance; out-patient appointments; and breastfeeding support group. List of other breastfeeding resources in the community given in the handout. Encouraged mother to call for problems or concerns related to breastfeeding. Per mom was given  A hand pump.    Maternal Data    Feeding    LATCH Score                   Interventions Interventions: Breast feeding basics reviewed;Hand pump  Lactation Tools Discussed/Used Tools: Pump Breast pump type: Manual   Consult Status Consult Status: Complete Date: 06/12/18    Jamie Barker Barker, 10:07 AM

## 2018-06-12 NOTE — Discharge Summary (Signed)
Obstetrics Discharge Summary OB/GYN Faculty Practice   Patient Name: Jamie Barker DOB: 31-Aug-1990 MRN: [id number]  Date of admission: 06/09/2018 Delivering MD: Oralia ManisABRAHAM, SHERIN   Date of discharge: 06/12/2018  Admitting diagnosis: 39 WKS, LEAKING FLUIDS Intrauterine pregnancy: 2115w6d     Secondary diagnosis:   Active Problems:   Pregnancy as incidental finding   Amniotic fluid leaking   Blood type, Rh negative   Late prenatal care   Anemia     Discharge diagnosis: Term Pregnancy Delivered                                            Postpartum procedures: None  Complications: none  Hospital course: Jamie Barker is a 27 y.o. 1115w6d who was admitted for PROM. Her pregnancy was complicated by limited/late prenatal care, Rh negative. Her labor course was notable for augmentation with pitocin. Delivery was complicated by loose nuchal cord and hand presentation. Please see delivery/op note for additional details. Her postpartum course was uncomplicated. She was breastfeeding without difficulty. By day of discharge, she was passing flatus, urinating, eating and drinking without difficulty. Her pain was well-controlled. She will follow-up in clinic in 4 weeks.   Physical exam  Vitals:   06/11/18 1353 06/11/18 1440 06/11/18 2237 06/12/18 0557  BP: (!) 133/94 123/67 105/60 134/87  Pulse: 78 80 76 78  Resp: 19 19 16    Temp: 98.2 F (36.8 C) 98.5 F (36.9 C) 98.8 F (37.1 C)   TempSrc: Oral Oral Oral   SpO2:   100%   Weight:      Height:       General: well appearing in no acute distress Lochia: appropriate Uterine Fundus: firm Incision: N/A DVT Evaluation: No evidence of DVT seen on physical exam. Labs: Lab Results  Component Value Date   WBC 11.4 (H) 06/10/2018   HGB 10.1 (L) 06/10/2018   HCT 31.8 (L) 06/10/2018   MCV 82.6 06/10/2018   PLT 348 06/10/2018   No flowsheet data found.  Discharge instructions: Per After Visit Summary and "Baby and Me Booklet"  After  visit meds:  Allergies as of 06/12/2018   No Known Allergies     Medication List    TAKE these medications   acetaminophen 500 MG tablet Commonly known as:  TYLENOL Take 1,000 mg by mouth every 6 (six) hours as needed for moderate pain.       Postpartum contraception: IUD Mirena Diet: Routine Diet Activity: Advance as tolerated. Pelvic rest for 6 weeks.   Outpatient follow up:4 weeks Follow-up Appt:No future appointments. Follow-up Visit:No follow-ups on file.  Newborn Data: Live born female  Birth Weight: 6 lb 0.5 oz (2735 g) APGAR: 8, 9  Newborn Delivery   Birth date/time:  06/10/2018 11:47:00 Delivery type:  Vaginal, Spontaneous     Baby Feeding: Breast Disposition:home with mother  Aura CampsJessica Saleah Rishel, MD OB/GYN Fellow, Faculty Practice

## 2018-06-12 NOTE — Discharge Instructions (Signed)

## 2018-06-14 LAB — TYPE AND SCREEN
ABO/RH(D): O NEG
Antibody Screen: POSITIVE
UNIT DIVISION: 0
Unit division: 0

## 2018-06-14 LAB — BPAM RBC
Blood Product Expiration Date: 201911282359
Blood Product Expiration Date: 201911302359
UNIT TYPE AND RH: 9500
UNIT TYPE AND RH: 9500
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
## 2020-08-22 ENCOUNTER — Inpatient Hospital Stay (HOSPITAL_COMMUNITY)
Admission: EM | Admit: 2020-08-22 | Discharge: 2020-08-22 | Disposition: A | Discharge status: Home / Self Care | Payer: Medicaid Other | Attending: Obstetrics & Gynecology | Admitting: Obstetrics & Gynecology

## 2020-08-22 ENCOUNTER — Encounter (HOSPITAL_COMMUNITY): Payer: Self-pay | Admitting: Emergency Medicine

## 2020-08-22 ENCOUNTER — Other Ambulatory Visit: Payer: Self-pay

## 2020-08-22 DIAGNOSIS — O26893 Other specified pregnancy related conditions, third trimester: Secondary | ICD-10-CM | POA: Insufficient documentation

## 2020-08-22 DIAGNOSIS — Z3689 Encounter for other specified antenatal screening: Secondary | ICD-10-CM

## 2020-08-22 DIAGNOSIS — Z3A31 31 weeks gestation of pregnancy: Secondary | ICD-10-CM | POA: Diagnosis not present
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
## 2020-10-08 ENCOUNTER — Other Ambulatory Visit: Payer: Self-pay

## 2020-10-08 DIAGNOSIS — D509 Iron deficiency anemia, unspecified: Secondary | ICD-10-CM | POA: Insufficient documentation

## 2020-10-10 ENCOUNTER — Ambulatory Visit: Admission: RE | Admit: 2020-10-10 | Payer: No Typology Code available for payment source | Source: Ambulatory Visit

## 2020-10-11 ENCOUNTER — Other Ambulatory Visit: Payer: Self-pay

## 2020-10-11 ENCOUNTER — Ambulatory Visit
Admission: RE | Admit: 2020-10-11 | Discharge: 2020-10-11 | Disposition: A | Discharge status: Home / Self Care | Payer: Medicaid Other | Source: Ambulatory Visit | Attending: Psychiatry | Admitting: Psychiatry

## 2020-10-11 DIAGNOSIS — Z3A00 Weeks of gestation of pregnancy not specified: Secondary | ICD-10-CM | POA: Diagnosis not present

## 2020-10-11 DIAGNOSIS — D509 Iron deficiency anemia, unspecified: Secondary | ICD-10-CM | POA: Diagnosis not present

## 2020-10-11 DIAGNOSIS — O99013 Anemia complicating pregnancy, third trimester: Secondary | ICD-10-CM | POA: Insufficient documentation

## 2020-10-11 MED ORDER — SODIUM CHLORIDE 0.9 % IV SOLN
100.0000 mg | INTRAVENOUS | Status: DC
  Administered 2020-10-11: 100 mg via INTRAVENOUS
  Filled 2020-10-11: qty 5

## 2020-10-11 NOTE — Discharge Instructions (Signed)
[employer] medicine (25th ed., pp. [phone]). [place], [place]: Elsevier.">  Anemia  Anemia is a condition in which there is not enough red blood cells or hemoglobin in the blood. Hemoglobin is a substance in red blood cells that carries oxygen. When you do not have enough red blood cells or hemoglobin (are anemic), your body cannot get enough oxygen and your organs may not work properly. As a result, you may feel very tired or have other problems. What are the causes? Common causes of anemia include:  Excessive bleeding. Anemia can be caused by excessive bleeding inside or outside the body, including bleeding from the intestines or from heavy menstrual periods in females.  Poor nutrition.  Long-lasting (chronic) kidney, thyroid, and liver disease.  Bone marrow disorders, spleen problems, and blood disorders.  Cancer and treatments for cancer.  HIV (human immunodeficiency virus) and AIDS (acquired immunodeficiency syndrome).  Infections, medicines, and autoimmune disorders that destroy red blood cells. What are the signs or symptoms? Symptoms of this condition include:  Minor weakness.  Dizziness.  Headache, or difficulties concentrating and sleeping.  Heartbeats that feel irregular or faster than normal (palpitations).  Shortness of breath, especially with exercise.  Pale skin, lips, and nails, or cold hands and feet.  Indigestion and nausea. Symptoms may occur suddenly or develop slowly. If your anemia is mild, you may not have symptoms. How is this diagnosed? This condition is diagnosed based on blood tests, your medical history, and a physical exam. In some cases, a test may be needed in which cells are removed from the soft tissue inside of a bone and looked at under a microscope (bone marrow biopsy). Your health care provider may also check your stool (feces) for blood and may do additional testing to look for the cause of your bleeding. Other tests may  include:  Imaging tests, such as a CT scan or MRI.  A procedure to see inside your esophagus and stomach (endoscopy).  A procedure to see inside your colon and rectum (colonoscopy). How is this treated? Treatment for this condition depends on the cause. If you continue to lose a lot of blood, you may need to be treated at a hospital. Treatment may include:  Taking supplements of iron, vitamin Q68, or folic acid.  Taking a hormone medicine (erythropoietin) that can help to stimulate red blood cell growth.  Having a blood transfusion. This may be needed if you lose a lot of blood.  Making changes to your diet.  Having surgery to remove your spleen. Follow these instructions at home:  Take over-the-counter and prescription medicines only as told by your health care provider.  Take supplements only as told by your health care provider.  Follow any diet instructions that you were given by your health care provider.  Keep all follow-up visits as told by your health care provider. This is important. Contact a health care provider if:  You develop new bleeding anywhere in the body. Get help right away if:  You are very weak.  You are short of breath.  You have pain in your abdomen or chest.  You are dizzy or feel faint.  You have trouble concentrating.  You have bloody stools, black stools, or tarry stools.  You vomit repeatedly or you vomit up blood. These symptoms may represent a serious problem that is an emergency. Do not wait to see if the symptoms will go away. Get medical help right away. Call your local emergency services (911 in the [place] Do not  drive yourself to the hospital. Summary  Anemia is a condition in which you do not have enough red blood cells or enough of a substance in your red blood cells that carries oxygen (hemoglobin).  Symptoms may occur suddenly or develop slowly.  If your anemia is mild, you may not have symptoms.  This condition is  diagnosed with blood tests, a medical history, and a physical exam. Other tests may be needed.  Treatment for this condition depends on the cause of the anemia. This information is not intended to replace advice given to you by your health care provider. Make sure you discuss any questions you have with your health care provider. Document Revised: 06/21/2019 Document Reviewed: 06/21/2019 Elsevier Patient Education  2021 [employer] Inc.  

## 2020-10-20 ENCOUNTER — Encounter (HOSPITAL_COMMUNITY): Payer: Self-pay | Admitting: Family Medicine

## 2020-10-20 ENCOUNTER — Inpatient Hospital Stay (HOSPITAL_COMMUNITY): Payer: Medicaid Other | Admitting: Anesthesiology

## 2020-10-20 ENCOUNTER — Inpatient Hospital Stay (HOSPITAL_COMMUNITY)
Admission: AD | Admit: 2020-10-20 | Discharge: 2020-10-22 | DRG: 807 | Disposition: A | Discharge status: Home / Self Care | Payer: Medicaid Other | Attending: Obstetrics and Gynecology | Admitting: Obstetrics and Gynecology

## 2020-10-20 ENCOUNTER — Other Ambulatory Visit: Payer: Self-pay

## 2020-10-20 DIAGNOSIS — Z3A39 39 weeks gestation of pregnancy: Secondary | ICD-10-CM

## 2020-10-20 DIAGNOSIS — O9902 Anemia complicating childbirth: Secondary | ICD-10-CM | POA: Diagnosis present

## 2020-10-20 DIAGNOSIS — Z20822 Contact with and (suspected) exposure to covid-19: Secondary | ICD-10-CM | POA: Diagnosis present

## 2020-10-20 DIAGNOSIS — D649 Anemia, unspecified: Secondary | ICD-10-CM | POA: Diagnosis present

## 2020-10-20 DIAGNOSIS — Z8759 Personal history of other complications of pregnancy, childbirth and the puerperium: Secondary | ICD-10-CM | POA: Diagnosis not present

## 2020-10-20 DIAGNOSIS — O26893 Other specified pregnancy related conditions, third trimester: Secondary | ICD-10-CM | POA: Diagnosis present

## 2020-10-20 DIAGNOSIS — O0930 Supervision of pregnancy with insufficient antenatal care, unspecified trimester: Secondary | ICD-10-CM

## 2020-10-20 DIAGNOSIS — Z6791 Unspecified blood type, Rh negative: Secondary | ICD-10-CM | POA: Diagnosis present

## 2020-10-20 DIAGNOSIS — O326XX0 Maternal care for compound presentation, not applicable or unspecified: Secondary | ICD-10-CM

## 2020-10-20 DIAGNOSIS — D509 Iron deficiency anemia, unspecified: Secondary | ICD-10-CM | POA: Diagnosis present

## 2020-10-20 DIAGNOSIS — Z9889 Other specified postprocedural states: Secondary | ICD-10-CM | POA: Diagnosis not present

## 2020-10-20 LAB — RESP PANEL BY RT-PCR (FLU A&B, COVID) ARPGX2
Influenza A by PCR: NEGATIVE
Influenza B by PCR: NEGATIVE
SARS Coronavirus 2 by RT PCR: NEGATIVE

## 2020-10-20 LAB — CBC
HCT: 33.3 % — ABNORMAL LOW (ref 36.0–46.0)
Hemoglobin: 10.7 g/dL — ABNORMAL LOW (ref 12.0–15.0)
MCH: 28 pg (ref 26.0–34.0)
MCHC: 32.1 g/dL (ref 30.0–36.0)
MCV: 87.2 fL (ref 80.0–100.0)
Platelets: 291 10*3/uL (ref 150–400)
RBC: 3.82 MIL/uL — ABNORMAL LOW (ref 3.87–5.11)
RDW: 14.9 % (ref 11.5–15.5)
WBC: 10.8 10*3/uL — ABNORMAL HIGH (ref 4.0–10.5)
nRBC: 0 % (ref 0.0–0.2)

## 2020-10-20 LAB — TYPE AND SCREEN
ABO/RH(D): O NEG
Antibody Screen: POSITIVE

## 2020-10-20 LAB — RPR: RPR Ser Ql: NONREACTIVE

## 2020-10-20 LAB — POCT FERN TEST: POCT Fern Test: POSITIVE

## 2020-10-20 MED ORDER — LACTATED RINGERS AMNIOINFUSION: INTRAVENOUS | Status: DC

## 2020-10-20 MED ORDER — PHENYLEPHRINE 40 MCG/ML (10ML) SYRINGE FOR IV PUSH (FOR BLOOD PRESSURE SUPPORT): 80.0000 ug | PREFILLED_SYRINGE | INTRAVENOUS | Status: DC | PRN

## 2020-10-20 MED ORDER — RHO D IMMUNE GLOBULIN 1500 UNIT/2ML IJ SOSY
300.0000 ug | PREFILLED_SYRINGE | Freq: Once | INTRAMUSCULAR | Status: AC
  Administered 2020-10-20: 300 ug via INTRAVENOUS
  Filled 2020-10-20: qty 2

## 2020-10-20 MED ORDER — PRENATAL MULTIVITAMIN CH
1.0000 | ORAL_TABLET | Freq: Every day | ORAL | Status: DC
  Administered 2020-10-20 – 2020-10-22 (×3): 1 via ORAL
  Filled 2020-10-20 (×3): qty 1

## 2020-10-20 MED ORDER — EPHEDRINE 5 MG/ML INJ: 10.0000 mg | INTRAVENOUS | Status: DC | PRN

## 2020-10-20 MED ORDER — LIDOCAINE-EPINEPHRINE (PF) 2 %-1:200000 IJ SOLN
INTRAMUSCULAR | Status: DC | PRN
  Administered 2020-10-20: 5 mL via EPIDURAL

## 2020-10-20 MED ORDER — IBUPROFEN 600 MG PO TABS
600.0000 mg | ORAL_TABLET | Freq: Four times a day (QID) | ORAL | Status: DC
  Administered 2020-10-20 – 2020-10-22 (×9): 600 mg via ORAL
  Filled 2020-10-20 (×9): qty 1

## 2020-10-20 MED ORDER — FENTANYL-BUPIVACAINE-NACL 0.5-0.125-0.9 MG/250ML-% EP SOLN
12.0000 mL/h | EPIDURAL | Status: DC | PRN
Start: 2020-10-20 — End: 2020-10-20
  Administered 2020-10-20: 12 mL/h via EPIDURAL
  Filled 2020-10-20: qty 250

## 2020-10-20 MED ORDER — FENTANYL-BUPIVACAINE-NACL 0.5-0.125-0.9 MG/250ML-% EP SOLN
12.0000 mL/h | EPIDURAL | Status: DC | PRN
Start: 2020-10-20 — End: 2020-10-20

## 2020-10-20 MED ORDER — DIBUCAINE (PERIANAL) 1 % EX OINT: 1.0000 "application " | TOPICAL_OINTMENT | CUTANEOUS | Status: DC | PRN

## 2020-10-20 MED ORDER — SIMETHICONE 80 MG PO CHEW: 80.0000 mg | CHEWABLE_TABLET | ORAL | Status: DC | PRN

## 2020-10-20 MED ORDER — ONDANSETRON HCL 4 MG/2ML IJ SOLN: 4.0000 mg | Freq: Four times a day (QID) | INTRAMUSCULAR | Status: DC | PRN

## 2020-10-20 MED ORDER — LIDOCAINE HCL (PF) 1 % IJ SOLN: 30.0000 mL | INTRAMUSCULAR | Status: DC | PRN

## 2020-10-20 MED ORDER — ACETAMINOPHEN 325 MG PO TABS: 650.0000 mg | ORAL_TABLET | ORAL | Status: DC | PRN

## 2020-10-20 MED ORDER — BENZOCAINE-MENTHOL 20-0.5 % EX AERO: 1.0000 "application " | INHALATION_SPRAY | CUTANEOUS | Status: DC | PRN

## 2020-10-20 MED ORDER — ONDANSETRON HCL 4 MG/2ML IJ SOLN: 4.0000 mg | INTRAMUSCULAR | Status: DC | PRN

## 2020-10-20 MED ORDER — LACTATED RINGERS IV SOLN: 500.0000 mL | INTRAVENOUS | Status: DC | PRN

## 2020-10-20 MED ORDER — SOD CITRATE-CITRIC ACID 500-334 MG/5ML PO SOLN: 30.0000 mL | ORAL | Status: DC | PRN

## 2020-10-20 MED ORDER — DIPHENHYDRAMINE HCL 25 MG PO CAPS: 25.0000 mg | ORAL_CAPSULE | Freq: Four times a day (QID) | ORAL | Status: DC | PRN

## 2020-10-20 MED ORDER — LACTATED RINGERS IV SOLN: 500.0000 mL | Freq: Once | INTRAVENOUS | Status: DC

## 2020-10-20 MED ORDER — DIPHENHYDRAMINE HCL 50 MG/ML IJ SOLN: 12.5000 mg | INTRAMUSCULAR | Status: DC | PRN

## 2020-10-20 MED ORDER — OXYTOCIN-SODIUM CHLORIDE 30-0.9 UT/500ML-% IV SOLN
2.5000 [IU]/h | INTRAVENOUS | Status: DC
  Administered 2020-10-20: 2.5 [IU]/h via INTRAVENOUS
  Filled 2020-10-20: qty 500

## 2020-10-20 MED ORDER — WITCH HAZEL-GLYCERIN EX PADS: 1.0000 "application " | MEDICATED_PAD | CUTANEOUS | Status: DC | PRN

## 2020-10-20 MED ORDER — TETANUS-DIPHTH-ACELL PERTUSSIS 5-2.5-18.5 LF-MCG/0.5 IM SUSY: 0.5000 mL | PREFILLED_SYRINGE | Freq: Once | INTRAMUSCULAR | Status: DC

## 2020-10-20 MED ORDER — OXYTOCIN BOLUS FROM INFUSION
333.0000 mL | Freq: Once | INTRAVENOUS | Status: AC
  Administered 2020-10-20: 333 mL via INTRAVENOUS

## 2020-10-20 MED ORDER — SENNOSIDES-DOCUSATE SODIUM 8.6-50 MG PO TABS
2.0000 | ORAL_TABLET | ORAL | Status: DC
  Administered 2020-10-20 – 2020-10-22 (×3): 2 via ORAL
  Filled 2020-10-20 (×3): qty 2

## 2020-10-20 MED ORDER — LACTATED RINGERS IV SOLN
500.0000 mL | Freq: Once | INTRAVENOUS | Status: AC
  Administered 2020-10-20: 500 mL via INTRAVENOUS

## 2020-10-20 MED ORDER — COCONUT OIL OIL: 1.0000 "application " | TOPICAL_OIL | Status: DC | PRN

## 2020-10-20 MED ORDER — ZOLPIDEM TARTRATE 5 MG PO TABS: 5.0000 mg | ORAL_TABLET | Freq: Every evening | ORAL | Status: DC | PRN

## 2020-10-20 MED ORDER — LACTATED RINGERS IV SOLN: INTRAVENOUS | Status: DC

## 2020-10-20 MED ORDER — ONDANSETRON HCL 4 MG PO TABS: 4.0000 mg | ORAL_TABLET | ORAL | Status: DC | PRN

## 2020-10-20 NOTE — Plan of Care (Signed)

## 2020-10-20 NOTE — Discharge Summary (Addendum)
Postpartum Discharge Summary  Date of Service updated 10/22/20    Patient Name: Jamie Barker DOB: 1991-03-09 MRN: [id number]  Date of admission: 10/20/2020 Delivery date:10/20/2020  Delivering provider: Darlina Rumpf  Date of discharge: 10/22/2020  Admitting diagnosis: Labor and delivery indication for care or intervention [O75.9] Intrauterine pregnancy: [redacted]w[redacted]d    Secondary diagnosis:  Principal Problem:   Vaginal delivery Active Problems:   Blood type, Rh negative   Late prenatal care   Anemia   Labor and delivery indication for care or intervention  Additional problems: Cat II tracings with decelerations    Discharge diagnosis: Term Pregnancy Delivered                                              Post partum procedures:rhogam Augmentation: N/A Complications: None  Hospital course: Onset of Labor With Vaginal Delivery      30y.o. yo [id number] 356w3das admitted in Active Labor on 10/20/2020. Patient had an uncomplicated labor course as follows:  Membrane Rupture Time/Date: 3:00 AM ,10/20/2020   Delivery Method:Vaginal, Spontaneous  Episiotomy: None  Lacerations:  Labial  Patient had an uncomplicated postpartum course.  She is ambulating, tolerating a regular diet, passing flatus, and urinating well. Patient is discharged home in stable condition on 10/22/20. Rhogam administered prior to discharge.  Newborn Data: Birth date:10/20/2020  Birth time:7:01 AM  Gender:Female  Living status:Living  Apgars:7 ,9  Weight:3311 g   Magnesium Sulfate received: No BMZ received: No Rhophylac:No MMR:No T-DaP:Given prenatally Flu: No Transfusion:No  Physical exam  Vitals:   10/21/20 0456 10/21/20 1400 10/21/20 2000 10/22/20 0526  BP: 121/74 114/71 136/81 133/75  Pulse: 76 70 67 83  Resp: _0 Temp: 98 F (36.7 C) 98.3 F (36.8 C) 98 F (36.7 C) 98.2 F (36.8 C)  TempSrc: Oral Oral Oral Oral  SpO2: 100%  99% 95%  Weight:      Height:       General:  alert and cooperative Lochia: appropriate Uterine Fundus: firm Incision: N/A DVT Evaluation: No evidence of DVT seen on physical exam.  Labs: Lab Results  Component Value Date   WBC 10.8 (H) 10/20/2020   HGB 10.7 (L) 10/20/2020   HCT 33.3 (L) 10/20/2020   MCV 87.2 10/20/2020   PLT 291 10/20/2020   No flowsheet data found. Edinburgh Score: Edinburgh Postnatal Depression Scale Screening Tool 10/21/2020  I have been able to laugh and see the funny side of things. 0  I have looked forward with enjoyment to things. 0  I have blamed myself unnecessarily when things went wrong. 1  I have been anxious or worried for no good reason. 0  I have felt scared or panicky for no good reason. 0  Things have been getting on top of me. 0  I have been so unhappy that I have had difficulty sleeping. 0  I have felt sad or miserable. 0  I have been so unhappy that I have been crying. 0  The thought of harming myself has occurred to me. 0  Edinburgh Postnatal Depression Scale Total 1   After visit meds:  Allergies as of 10/22/2020   No Known Allergies     Medication List    TAKE these medications   acetaminophen 500 MG tablet Commonly known as: TYLENOL Take 1,000 mg by mouth every 6 (six)  hours as needed for moderate pain.   coconut oil Oil Apply 1 application topically as needed (nipple pain).   ibuprofen 600 MG tablet Commonly known as: ADVIL Take 1 tablet (600 mg total) by mouth every 6 (six) hours.   prenatal multivitamin Tabs tablet Take 1 tablet by mouth daily at 12 noon.      Discharge home in stable condition Infant Feeding: Breast Infant Disposition:home with mother Discharge instruction: per After Visit Summary and Postpartum booklet. Activity: Advance as tolerated. Pelvic rest for 6 weeks.  Diet: routine diet Future Appointments:No future appointments. Follow up Visit:  Please schedule this patient for a In person postpartum visit in 6 weeks with provider Additional  Postpartum F/U:Postpartum Depression checkup  Low risk pregnancy complicated by: None Delivery mode:  Vaginal, Spontaneous  Anticipated Birth Control:  Depo  Randa Ngo, MD OB Fellow, Faculty Practice 10/22/2020 9:00 AM

## 2020-10-20 NOTE — Discharge Instructions (Signed)

## 2020-10-20 NOTE — H&P (Signed)
OBSTETRIC ADMISSION HISTORY AND PHYSICAL  Jamie Barker is a 30 y.o. female [id number] with IUP at [redacted]w[redacted]d by L/22 presenting for spontaneous labor s/p SROM. She reports +FMs, no VB, no blurry vision, headaches or peripheral edema, and RUQ pain.  She plans on breast feeding. She is undecided for birth control.  She received her prenatal care at  [facility]    Dating: By L/22 --->  Estimated Date of Delivery: 10/24/20  Sono:  @[redacted]w[redacted]d , CWD, normal anatomy, cephalic presentation, anterior placenta per CareEverywhere  Prenatal History/Complications:  -Rh negative status -Anemia (po iron supplementation in pregnancy) -Late prenatal care (established at [redacted] weeks GA) -H/o Hepatitis C exposure (needle stick in 2018 with negative viral load in pregnancy)  Past Medical History: Past Medical History:  Diagnosis Date   Anemia    FH: hypertension    History of positive PPD, untreated    Late prenatal care    Rh negative status during pregnancy    UTI (urinary tract infection)     Past Surgical History: Past Surgical History:  Procedure Laterality Date   WISDOM TOOTH EXTRACTION      Obstetrical History: OB History     Gravida  5   Para  4   Term  4   Preterm  0   AB  0   Living  4      SAB  0   IAB  0   Ectopic  0   Multiple  0   Live Births  4           Social History Social History   Socioeconomic History   Marital status: Married    Spouse name: Not on file   Number of children: Not on file   Years of education: Not on file   Highest education level: Not on file  Occupational History   Not on file  Tobacco Use   Smoking status: Never Smoker   Smokeless tobacco: Never Used  Substance and Sexual Activity   Alcohol use: No   Drug use: No   Sexual activity: Yes    Birth control/protection: I.U.D.    Comment: para guard IUD  Other Topics Concern   Not on file  Social History Narrative   Not on file   Social Determinants of Health    Financial Resource Strain: Not on file  Food Insecurity: Not on file  Transportation Needs: Not on file  Physical Activity: Not on file  Stress: Not on file  Social Connections: Not on file    Family History: Family History  Problem Relation Age of Onset   Anemia Mother    Hypertension Father    Consanguinity Maternal Aunt    Consanguinity Maternal Uncle    Consanguinity Paternal Aunt    Consanguinity Paternal Uncle    Consanguinity Cousin     Allergies: No Known Allergies  Medications Prior to Admission  Medication Sig Dispense Refill Last Dose   acetaminophen (TYLENOL) 500 MG tablet Take 1,000 mg by mouth every 6 (six) hours as needed for moderate pain.        Review of Systems   All systems reviewed and negative except as stated in HPI  unknown if currently breastfeeding. General appearance: alert, cooperative and appears stated age Lungs: normal WOB Heart: regular rate  Abdomen: soft, non-tender Extremities: no sign of DVT Presentation: cephalic Fetal monitoringBaseline: 120 bpm, Variability: Good {> 6 bpm), Accelerations: present and Decelerations: Variable: moderate Uterine activityFrequency: Every 2-4 minutes  Prenatal labs: ABO, Rh:  O negative Antibody:  negative Rubella:  immune RPR:   non-reactive HBsAg:   non-reactive HIV:   non-reactive GBS:   negative 1 hr Glucola: wnl Genetic screening declined Anatomy US wnl at [redacted]w[redacted]d  Prenatal Transfer Tool  Maternal Diabetes: No Genetic Screening: Declined Maternal Ultrasounds/Referrals: Normal Fetal Ultrasounds or other Referrals:  None Maternal Substance Abuse:  No Significant Maternal Medications:  None Significant Maternal Lab Results: Group B Strep negative  No results found for this or any previous visit (from the past 24 hour(s)).  Patient Active Problem List   Diagnosis Date Noted   Maternal iron deficiency anemia affecting pregnancy in third trimester, antepartum 10/08/2020    Obesity with body mass index 30 or greater 06/12/2018   Late prenatal care 06/12/2018   Amniotic fluid leaking 06/10/2018   Anemia 05/18/2018   Blood type, Rh negative 05/10/2018   Indication for care in labor or delivery 12/28/2016   Exposure to hepatitis C 11/27/2016   Active labor 11/20/2013   Pregnancy, post-term 10/20/2011   Pregnancy as incidental finding 10/18/2011    Assessment/Plan:  Jamie Barker is a 30 y.o. [id number] at [redacted]w[redacted]d here for for management of spontaneous onset of labor s/p SROM.  #Labor: Will plan to manage expectantly and augment as clinically indicated. #Pain: TBD per pt preference #FWB: Category 2 strip given resurr #ID: GBS negative #MOF: breast #MOC: breast #Circ: unsure #Anemia: po iron supplementation in pregnancy. F/u CBC on admission. #Rh negative status: plan for rhogam workup in postpartum period #Late to [facility]: plan for social work consult s/p delivery #Hepatitis C exposure (2018): pt with undetectable viral loads x2 per [facility] records. NTD.  Jamie Oats, MD OB Fellow, Faculty Practice 10/20/2020 5:15 AM

## 2020-10-20 NOTE — Progress Notes (Signed)
Sam Reita Cliche CNM notified of pt's admission and status. Aware of SROM cl fld, sve, variables. Will admit to [facility]

## 2020-10-20 NOTE — Anesthesia Preprocedure Evaluation (Signed)
Anesthesia Evaluation  Patient identified by MRN, date of birth, ID band Patient awake    Reviewed: Allergy & Precautions, NPO status , Patient's Chart, lab work & pertinent test results  Airway Mallampati: III       Dental   Pulmonary    Pulmonary exam normal        Cardiovascular Normal cardiovascular exam     Neuro/Psych    GI/Hepatic   Endo/Other    Renal/GU      Musculoskeletal   Abdominal   Peds  Hematology   Anesthesia Other Findings   Reproductive/Obstetrics (+) Pregnancy                             Anesthesia Physical Anesthesia Plan  ASA: II  Anesthesia Plan: Epidural   Post-op Pain Management:    Induction:   PONV Risk Score and Plan: 0  Airway Management Planned: Natural Airway  Additional Equipment: None  Intra-op Plan:   Post-operative Plan:   Informed Consent: I have reviewed the patients History and Physical, chart, labs and discussed the procedure including the risks, benefits and alternatives for the proposed anesthesia with the patient or authorized representative who has indicated his/her understanding and acceptance.       Plan Discussed with:   Anesthesia Plan Comments: (Lab Results      Component                Value               Date                      WBC                      10.8 (H)            10/20/2020                HGB                      10.7 (L)            10/20/2020                HCT                      33.3 (L)            10/20/2020                MCV                      87.2                10/20/2020                PLT                      291                 10/20/2020           )        Anesthesia Quick Evaluation

## 2020-10-20 NOTE — Progress Notes (Signed)
CSW received consult for late and limited PNC.  CSW reviewed chart and is screening out consult as it does not meet criteria for automatic CSW involvement and infant drug screening.  MOB started care prior to 28 weeks and had more than 3 visits (started care at [redacted]w[redacted]d and had 8 visits).  Please contact CSW if current concerns arise or by MOB's request.  CSW screening out consult for hx PPD. CSW unable to find any documentation reflecting a history of PPD for MOB.   Jamie Pickerill, LCSW Clinical Social Worker [facility] Cell#: [phone]  

## 2020-10-20 NOTE — Anesthesia Postprocedure Evaluation (Signed)
Anesthesia Post Note  Patient: Jamie Barker  Procedure(s) Performed: AN AD HOC LABOR EPIDURAL     Patient location during evaluation: Mother Baby Anesthesia Type: Epidural Level of consciousness: awake and alert Pain management: pain level controlled Vital Signs Assessment: post-procedure vital signs reviewed and stable Respiratory status: spontaneous breathing, nonlabored ventilation and respiratory function stable Cardiovascular status: stable Postop Assessment: no headache, no backache and epidural receding Anesthetic complications: no   No complications documented.  Last Vitals:  Vitals:   10/20/20 1046 10/20/20 1501  BP: 140/75 116/67  Pulse: 86 70  Resp: 18 18  Temp: 36.9 C 36.7 C  SpO2:      Last Pain:  Vitals:   10/20/20 1501  TempSrc: Oral  PainSc:    Pain Goal:                   [facility]

## 2020-10-20 NOTE — Anesthesia Procedure Notes (Signed)
Epidural Patient location during procedure: OB Start time: 10/20/2020 5:47 AM End time: 10/20/2020 5:53 AM  Staffing Anesthesiologist: Shelton Silvas, MD Performed: anesthesiologist   Preanesthetic Checklist Completed: patient identified, IV checked, site marked, risks and benefits discussed, surgical consent, monitors and equipment checked, pre-op evaluation and timeout performed  Epidural Patient position: sitting Prep: DuraPrep Patient monitoring: heart rate, continuous pulse ox and blood pressure Approach: midline Location: L3-L4 Injection technique: LOR saline  Needle:  Needle type: Tuohy  Needle gauge: 17 G Needle length: 9 cm Catheter type: closed end flexible Catheter size: 20 Guage Test dose: negative and 1.5% lidocaine  Assessment Events: blood not aspirated, injection not painful, no injection resistance and no paresthesia  Additional Notes LOR @ 5.5  Patient identified. Risks/Benefits/Options discussed with patient including but not limited to bleeding, infection, nerve damage, paralysis, failed block, incomplete pain control, headache, blood pressure changes, nausea, vomiting, reactions to medications, itching and postpartum back pain. Confirmed with bedside nurse the patient's most recent platelet count. Confirmed with patient that they are not currently taking any anticoagulation, have any bleeding history or any family history of bleeding disorders. Patient expressed understanding and wished to proceed. All questions were answered. Sterile technique was used throughout the entire procedure. Please see nursing notes for vital signs. Test dose was given through epidural catheter and negative prior to continuing to dose epidural or start infusion. Warning signs of high block given to the patient including shortness of breath, tingling/numbness in hands, complete motor block, or any concerning symptoms with instructions to call for help. Patient was given instructions on  fall risk and not to get out of bed. All questions and concerns addressed with instructions to call with any issues or inadequate analgesia.    Reason for block:procedure for pain

## 2020-10-21 DIAGNOSIS — Z8759 Personal history of other complications of pregnancy, childbirth and the puerperium: Secondary | ICD-10-CM

## 2020-10-21 DIAGNOSIS — Z9889 Other specified postprocedural states: Secondary | ICD-10-CM

## 2020-10-21 LAB — RH IG WORKUP (INCLUDES ABO/RH)
ABO/RH(D): O NEG
Fetal Screen: NEGATIVE
Gestational Age(Wks): 39.3
Unit division: 0

## 2020-10-21 MED ORDER — MEDROXYPROGESTERONE ACETATE 150 MG/ML IM SUSP
150.0000 mg | Freq: Once | INTRAMUSCULAR | Status: AC
  Administered 2020-10-22: 150 mg via INTRAMUSCULAR
  Filled 2020-10-21: qty 1

## 2020-10-21 NOTE — Lactation Note (Signed)
This note was copied from a baby's chart. Lactation Consultation Note  Patient Name: Jamie Barker [id number] Date: 10/21/2020 Reason for consult: Follow-up assessment;Mother's request Age:31 hours  LC in to room per mother's request. Mother had some questions about using DEBP and settings. All questions answered during this visit. Mother is pumping at this time using initiation setting.   Encouraged to contact [facility] for support and questions.   Maternal Data Has patient been taught Hand Expression?: Yes Does the patient have breastfeeding experience prior to this delivery?: Yes How long did the patient breastfeed?: combo feeding for 12 months x4  Feeding Mother's Current Feeding Choice: Breast Milk and Formula  LATCH Score Latch: Grasps breast easily, tongue down, lips flanged, rhythmical sucking.  Audible Swallowing: Spontaneous and intermittent  Type of Nipple: Everted at rest and after stimulation  Comfort (Breast/Nipple): Soft / non-tender  Hold (Positioning): Assistance needed to correctly position infant at breast and maintain latch.  LATCH Score: 9   Lactation Tools Discussed/Used Tools: Pump;Flanges Flange Size: 27 Breast pump type: Double-Electric Breast Pump Pump Education: Setup, frequency, and cleaning;Milk Storage Reason for Pumping: stimulation and supplementationn Pumping frequency: Q3  Interventions Interventions: DEBP;Expressed milk;Education  Discharge WIC Program: No  Consult Status Consult Status: Follow-up Date: 10/22/20 Follow-up type: In-patient    [name] 10/21/2020, 10:20 PM

## 2020-10-21 NOTE — Lactation Note (Signed)
This note was copied from a baby's chart. Lactation Consultation Note  Patient Name: Jamie Barker JQZES'P Date: 10/21/2020 Reason for consult: Initial assessment;Term;Difficult latch;MD order Age:31 hours  Initial visit to 31 hours old infant of a P5 mother. Infant is sleeping in basinet upon arrival. Mother has been planning to breast and formula feed this infant. Mother states breastfeeding has been challenging due to severe contraction pain. Mother explains she requested formula yesterday and has been formula feeding but infant is not taking enough.  Infant started cueing. Mother is interested in latching at this time, cradle position to left breast. LC assisted with hand expression prior to latch, colostrum is easily expressed. Infant latched easily, good alignment, suckling rhythmically with flanged lips, observed swallowing and breast tissue moving.  Mother experiencing contraction pain after 7-minutes at breast. She unlatched infant and proceeded to bottlefeed formula. LC provided paced bottlefeeding and frequent burping guidance. Infant transferred 27mL of formula.  LC set up DEBP. Demonstrated use, cleaning, and milk storage.   Plan: 1-Skin to skin 2-Aim for a deep, comfortable latch 3-Breastfeeding on demand or 8-12 times in 24h period. 4-Keep infant awake during breastfeeding session: massaging breast, infant's hand/shoulder/feet 5-Pump or hand-express and offer EBM prior to formula supplementation. 6-If needed supplement with formula following guidelines, paced bottle feeding and fullness cues.  7-Monitor voids and stools as signs good intake.  6-Encouraged maternal rest, hydration and food intake.  7-Contact LC as needed for feeds/support/concerns/questions  All questions answered at this time. Provided Lactation services brochure and promoted [employer] booklet information.    Maternal Data Has patient been taught Hand Expression?: Yes Does the patient have  breastfeeding experience prior to this delivery?: Yes How long did the patient breastfeed?: combo feeding for 12 months x4  Feeding Mother's Current Feeding Choice: Breast Milk and Formula  LATCH Score Latch: Grasps breast easily, tongue down, lips flanged, rhythmical sucking.  Audible Swallowing: Spontaneous and intermittent  Type of Nipple: Everted at rest and after stimulation  Comfort (Breast/Nipple): Soft / non-tender  Hold (Positioning): Assistance needed to correctly position infant at breast and maintain latch.  LATCH Score: 9   Lactation Tools Discussed/Used Tools: Pump;Flanges Flange Size: 27 Breast pump type: Double-Electric Breast Pump Pump Education: Setup, frequency, and cleaning;Milk Storage Reason for Pumping: stimulation and supplementationn Pumping frequency: Q3  Interventions Interventions: Breast feeding basics reviewed;Assisted with latch;Skin to skin;Breast massage;Hand express;Breast compression;Adjust position;DEBP;Hand pump;Expressed milk;Position options;Support pillows;Education  Discharge WIC Program: No  Consult Status Consult Status: Follow-up Date: 10/22/20 Follow-up type: In-patient    [name] 10/21/2020, 8:21 PM

## 2020-10-21 NOTE — Progress Notes (Signed)
Post Partum Day 1 Subjective: Patient is doing well without complaints. Ambulating without difficulty. Voiding and passing flatus. Tolerating PO. Abdominal pain improved. Vaginal bleeding decreased.  Objective: Blood pressure 121/74, pulse 76, temperature 98 F (36.7 C), temperature source Oral, resp. rate 18, height 5\' 4"  (1.626 m), weight 101.6 kg, SpO2 100 %, unknown if currently breastfeeding.  Physical Exam:  General: alert, cooperative and no distress Lochia: appropriate Uterine Fundus: firm Incision: n/a DVT Evaluation: No evidence of DVT seen on physical exam.  Recent Labs    10/20/20 0501  HGB 10.7*  HCT 33.3*    Assessment/Plan: PPD #1 VD  -doing well, meeting pp milestones  -breastfeeding going well  -depo for contraception  -desires circ for baby, discussed/consented  #Late PNC: cleared by SW  Desires discharge tomorrow.   LOS: 1 day   10/22/20 10/21/2020, 9:40 AM

## 2020-10-22 MED ORDER — PRENATAL MULTIVITAMIN CH: 1.0000 | ORAL_TABLET | Freq: Every day | ORAL | Status: AC

## 2020-10-22 MED ORDER — IBUPROFEN 600 MG PO TABS: 600.0000 mg | ORAL_TABLET | Freq: Four times a day (QID) | ORAL | 0 refills | Status: AC

## 2020-10-22 MED ORDER — COCONUT OIL OIL: 1.0000 "application " | TOPICAL_OIL | 0 refills | Status: AC | PRN

## 2020-10-22 NOTE — Lactation Note (Signed)
This note was copied from a baby's chart. Lactation Consultation Note  Patient Name: Jamie Barker [id number] Date: 10/22/2020 Reason for consult: Follow-up assessment Age:32 hours   LC arrived in mothers room and ST feeding infant . Infant took 24 ml's. ST used a N-fant slow flow nipple.  Mother reports that she last breastfed infant last night once. She reports that she pumped her breast yesterday and she was unable to get any volume. Mother reports that she is still having cramping when she breastfeeds and pumps.  Support and encouragement given.  Mother breastfed all other children.   Discussed treatment and prevention of engorgement . Mother plans to get a pump from [facility] . A WIC referral was faxed on 3/27.  LC ask mother to page with next feeding attempt and also if needed assistance with pumping.   Breastfeed infant with feeding cues Supplement infant with ebm/formula, according to supplemental guidelines. Pump using a DEBP after each feeding for 15-20 mins.   Mother to continue to cue base feed infant and feed at least 8-12 times or more in 24 hours and advised to allow for cluster feeding infant as needed.  Mother to continue to due STS. Mother is aware of available LC services at [facility], BFSG'S, OP Dept, and phone # for questions or concerns about breastfeeding.  Mother receptive to all teaching and plan of care.       Maternal Data    Feeding Mother's Current Feeding Choice: Breast Milk and Formula  LATCH Score                    Lactation Tools Discussed/Used    Interventions    Discharge Discharge Education: Engorgement and breast care;Warning signs for feeding baby  Consult Status      Stevan Born [facility] 10/22/2020, 8:33 AM

## 2022-02-11 ENCOUNTER — Other Ambulatory Visit: Payer: Self-pay

## 2022-02-11 ENCOUNTER — Emergency Department
Admission: EM | Admit: 2022-02-11 | Discharge: 2022-02-11 | Disposition: A | Discharge status: Home / Self Care | Payer: No Typology Code available for payment source | Attending: Emergency Medicine | Admitting: Emergency Medicine

## 2022-02-11 DIAGNOSIS — J029 Acute pharyngitis, unspecified: Secondary | ICD-10-CM | POA: Diagnosis present

## 2022-02-11 DIAGNOSIS — J020 Streptococcal pharyngitis: Secondary | ICD-10-CM | POA: Insufficient documentation

## 2022-02-11 DIAGNOSIS — Z20822 Contact with and (suspected) exposure to covid-19: Secondary | ICD-10-CM | POA: Diagnosis not present

## 2022-02-11 LAB — GROUP A STREP BY PCR: Group A Strep by PCR: DETECTED — AB

## 2022-02-11 LAB — RESP PANEL BY RT-PCR (FLU A&B, COVID) ARPGX2
Influenza A by PCR: NEGATIVE
Influenza B by PCR: NEGATIVE
SARS Coronavirus 2 by RT PCR: NEGATIVE

## 2022-02-11 MED ORDER — DEXAMETHASONE 4 MG PO TABS
6.0000 mg | ORAL_TABLET | Freq: Once | ORAL | Status: AC
  Administered 2022-02-11: 6 mg via ORAL
  Filled 2022-02-11: qty 2

## 2022-02-11 MED ORDER — ACETAMINOPHEN 325 MG PO TABS
650.0000 mg | ORAL_TABLET | Freq: Once | ORAL | Status: AC
  Administered 2022-02-11: 650 mg via ORAL
  Filled 2022-02-11: qty 2

## 2022-02-11 MED ORDER — KETOROLAC TROMETHAMINE 15 MG/ML IJ SOLN: 15.0000 mg | Freq: Once | INTRAMUSCULAR | Status: DC

## 2022-02-11 MED ORDER — PENICILLIN V POTASSIUM 500 MG PO TABS: 500.0000 mg | ORAL_TABLET | Freq: Three times a day (TID) | ORAL | 0 refills | Status: DC

## 2022-02-11 MED ORDER — PENICILLIN V POTASSIUM 500 MG PO TABS: 500.0000 mg | ORAL_TABLET | Freq: Three times a day (TID) | ORAL | 0 refills | Status: AC

## 2022-02-11 MED ORDER — DEXAMETHASONE 4 MG PO TABS: 10.0000 mg | ORAL_TABLET | Freq: Once | ORAL | Status: DC

## 2022-02-11 NOTE — ED Provider Triage Note (Signed)
Emergency Medicine Provider Triage Evaluation Note  Jamie Barker , a 31 y.o. female  was evaluated in triage.  Pt complains of sore throat and malaise.  Also now reports some pain to the left ear and feels like some fullness or swelling to the throat.  She denies any chest pain or shortness of breath.  Review of Systems  Positive: Sore throat Negative: FCS  Physical Exam  Ht 5\' 4"  (1.626 m)   Wt 99.8 kg   LMP  (Approximate)   Breastfeeding Yes   BMI 37.76 kg/m  Gen:   Awake, no distress  NAD Resp:  Normal effort  MSK:   Moves extremities without difficulty  Other:    Medical Decision Making  Medically screening exam initiated at 5:54 PM.  Appropriate orders placed.  Astaria Rorke was informed that the remainder of the evaluation will be completed by another provider, this initial triage assessment does not replace that evaluation, and the importance of remaining in the ED until their evaluation is complete.  Patient to the ED for evaluation of sore throat and left ear pain persistent and progressive for the last 6 days.   , PA-C 02/11/22 1756

## 2022-02-11 NOTE — ED Triage Notes (Signed)
Pt to ED for ongoing sore throat and painful swallowing since 6 days ago. Most of pain is to L throat and into L ear. States feels like throat getting more and more swollen. Had HA day before onset of sore throat. Endorses body aches.

## 2022-02-11 NOTE — ED Provider Notes (Signed)
[facility] Provider Note    Event Date/Time   First MD Initiated Contact with Patient 02/11/22 1819     (approximate)   History   Sore Throat   HPI  Jamie Barker is a 31 y.o. female who presents today for evaluation of sore throat.  Patient reports that her symptoms have been ongoing for approximately 6 days.  She reports that her children were recently sick with the same symptoms but they have recovered.  She denies fevers or chills.  She denies any difficulty swallowing, though reports that she has pain with swallowing.  She has not had any fevers or chills.  She has not had any nausea or vomiting.  She does report that she has generalized fatigue.        Physical Exam   Triage Vital Signs: ED Triage Vitals  Enc Vitals Group     BP 02/11/22 1754 140/83     Pulse Rate 02/11/22 1754 96     Resp 02/11/22 1754 16     Temp 02/11/22 1754 98.4 F (36.9 C)     Temp Source 02/11/22 1754 Oral     SpO2 02/11/22 1754 99 %     Weight 02/11/22 1752 220 lb (99.8 kg)     Height 02/11/22 1752 5\' 4"  (1.626 m)     Head Circumference --      Peak Flow --      Pain Score 02/11/22 1750 10     Pain Loc --      Pain Edu? --      Excl. in GC? --     Most recent vital signs: Vitals:   02/11/22 1754  BP: 140/83  Pulse: 96  Resp: 16  Temp: 98.4 F (36.9 C)  SpO2: 99%    Physical Exam Vitals and nursing note reviewed.  Constitutional:      General: Awake and alert. No acute distress.    Appearance: Normal appearance. The patient is normal weight.  HENT:     Head: Normocephalic and atraumatic.     Mouth: Mucous membranes are moist.  Uvula midline.  No tonsillar exudate.  Mild posterior oropharyngeal erythema. No soft palate fluctuance.  No trismus.  No voice change.  No sublingual swelling.  No tender cervical lymphadenopathy.  No nuchal rigidity. No drooling Eyes:     General: PERRL. Normal EOMs        Right eye: No discharge.        Left eye:  No discharge.     Conjunctiva/sclera: Conjunctivae normal.  Cardiovascular:     Rate and Rhythm: Normal rate and regular rhythm.     Pulses: Normal pulses.     Heart sounds: Normal heart sounds Pulmonary:     Effort: Pulmonary effort is normal. No respiratory distress.     Breath sounds: Normal breath sounds.  Abdominal:     Abdomen is soft. There is no abdominal tenderness.  Musculoskeletal:        General: No swelling. Normal range of motion.     Cervical back: Normal range of motion and neck supple.  Skin:    General: Skin is warm and dry.     Capillary Refill: Capillary refill takes less than 2 seconds.     Findings: No rash.  Neurological:     Mental Status: The patient is awake and alert.      ED Results / Procedures / Treatments   Labs (all labs ordered are listed, but only abnormal  results are displayed) Labs Reviewed  GROUP A STREP BY PCR - Abnormal; Notable for the following components:      Result Value   Group A Strep by PCR DETECTED (*)    All other components within normal limits  RESP PANEL BY RT-PCR (FLU A&B, COVID) [id number]     EKG     RADIOLOGY     PROCEDURES:  Critical Care performed:   Procedures   MEDICATIONS ORDERED IN ED: Medications  acetaminophen (TYLENOL) tablet 650 mg (650 mg Oral Given 02/11/22 1842)  dexamethasone (DECADRON) tablet 6 mg (6 mg Oral Given 02/11/22 1843)     IMPRESSION / MDM / ASSESSMENT AND PLAN / ED COURSE  I reviewed the triage vital signs and the nursing notes.   Differential diagnosis includes, but is not limited to, strep pharyngitis, viral pharyngitis, COVID, flu, abscess.  Patient is awake and alert, hemodynamically stable and afebrile.  She has posterior oropharyngeal erythema, though her uvula is midline, no tonsillar exudates, no nuchal rigidity, no trismus.  No voice change or drooling.  Presentation at this time is not consistent with peritonsillar or retropharyngeal abscess.  She is nontoxic in  appearance.  She was treated symptomatically with Decadron and Tylenol.  Strep test returned positive.  Patient was started on penicillin.  Prescription was originally sent to [facility], which patient prefers sent to a different pharmacy, therefore I called [facility] pharmacy to speak with the pharmacist and canceled the prescription.  We discussed the importance of taking the entire course of antibiotics even if she begins to feel better sooner.  Also advised that her children are tested and treated we discussed poststreptococcal complications.  We discussed return precautions.  Patient understands and understands and agrees with plan.  She was discharged in stable condition.   Patient's presentation is most consistent with acute complicated illness / injury requiring diagnostic workup.       FINAL CLINICAL IMPRESSION(S) / ED DIAGNOSES   Final diagnoses:  Strep throat     Rx / DC Orders   ED Discharge Orders          Ordered    penicillin v potassium (VEETID) 500 MG tablet  3 times daily,   Status:  Discontinued        02/11/22 1910    penicillin v potassium (VEETID) 500 MG tablet  3 times daily        02/11/22 1915             Note:  This document was prepared using Dragon voice recognition software and may include unintentional dictation errors.   Jamie Barker 02/11/22 2018    Dionne Bucy, MD 02/11/22 2144

## 2022-02-11 NOTE — Discharge Instructions (Addendum)
Please take the penicillin as prescribed for the full course of treatment even if you begin to feel better sooner.  Your children should also be tested and treated given the complications of untreated strep throat.  Please return for any new, worsening, or change in symptoms or other concerns.  It was a pleasure caring for you today.
